# Patient Record
Sex: Female | Born: 1960 | Race: White | Hispanic: No | Marital: Married | Smoking: Never smoker
Health system: Southern US, Community
[De-identification: ages and names within clinical notes are randomized; demographics above are authoritative.]

## PROBLEM LIST (undated history)

## (undated) DIAGNOSIS — Z8739 Personal history of other diseases of the musculoskeletal system and connective tissue: Secondary | ICD-10-CM

## (undated) DIAGNOSIS — Z8719 Personal history of other diseases of the digestive system: Secondary | ICD-10-CM

## (undated) DIAGNOSIS — Z9889 Other specified postprocedural states: Secondary | ICD-10-CM

## (undated) DIAGNOSIS — E785 Hyperlipidemia, unspecified: Secondary | ICD-10-CM

## (undated) HISTORY — DX: Other specified postprocedural states: Z98.890

## (undated) HISTORY — DX: Personal history of other diseases of the digestive system: Z87.19

## (undated) HISTORY — DX: Hyperlipidemia, unspecified: E78.5

## (undated) HISTORY — PX: TONSILLECTOMY: SUR1361

## (undated) HISTORY — DX: Personal history of other diseases of the musculoskeletal system and connective tissue: Z87.39

---

## 1984-08-16 HISTORY — PX: OTHER SURGICAL HISTORY: SHX169

## 1998-08-01 ENCOUNTER — Other Ambulatory Visit: Admission: RE | Admit: 1998-08-01 | Discharge: 1998-08-01 | Payer: Self-pay | Admitting: *Deleted

## 2000-12-08 ENCOUNTER — Encounter: Admission: RE | Admit: 2000-12-08 | Discharge: 2000-12-08 | Payer: Self-pay | Admitting: *Deleted

## 2000-12-08 ENCOUNTER — Encounter: Payer: Self-pay | Admitting: *Deleted

## 2001-01-06 ENCOUNTER — Other Ambulatory Visit: Admission: RE | Admit: 2001-01-06 | Discharge: 2001-01-06 | Payer: Self-pay | Admitting: *Deleted

## 2001-08-24 ENCOUNTER — Encounter (INDEPENDENT_AMBULATORY_CARE_PROVIDER_SITE_OTHER): Payer: Self-pay

## 2001-08-24 ENCOUNTER — Ambulatory Visit (HOSPITAL_COMMUNITY): Admission: RE | Admit: 2001-08-24 | Discharge: 2001-08-24 | Payer: Self-pay | Admitting: *Deleted

## 2003-07-19 ENCOUNTER — Encounter: Admission: RE | Admit: 2003-07-19 | Discharge: 2003-07-19 | Payer: Self-pay | Admitting: Family Medicine

## 2004-07-28 ENCOUNTER — Ambulatory Visit (HOSPITAL_COMMUNITY): Admission: RE | Admit: 2004-07-28 | Discharge: 2004-07-28 | Payer: Self-pay | Admitting: Family Medicine

## 2004-08-18 ENCOUNTER — Encounter: Admission: RE | Admit: 2004-08-18 | Discharge: 2004-08-18 | Payer: Self-pay | Admitting: Family Medicine

## 2005-02-24 ENCOUNTER — Ambulatory Visit: Payer: Self-pay | Admitting: Internal Medicine

## 2005-02-25 ENCOUNTER — Ambulatory Visit: Payer: Self-pay | Admitting: Internal Medicine

## 2005-08-16 HISTORY — PX: SURGERY OF LIP: SUR1315

## 2005-09-14 ENCOUNTER — Encounter: Admission: RE | Admit: 2005-09-14 | Discharge: 2005-09-14 | Payer: Self-pay | Admitting: Family Medicine

## 2005-09-16 ENCOUNTER — Encounter: Admission: RE | Admit: 2005-09-16 | Discharge: 2005-09-16 | Payer: Self-pay | Admitting: Family Medicine

## 2005-10-04 ENCOUNTER — Encounter: Admission: RE | Admit: 2005-10-04 | Discharge: 2006-01-02 | Payer: Self-pay | Admitting: Family Medicine

## 2006-09-15 ENCOUNTER — Encounter: Admission: RE | Admit: 2006-09-15 | Discharge: 2006-09-15 | Payer: Self-pay | Admitting: Family Medicine

## 2006-12-30 ENCOUNTER — Encounter: Admission: RE | Admit: 2006-12-30 | Discharge: 2006-12-30 | Payer: Self-pay | Admitting: Gastroenterology

## 2007-05-29 ENCOUNTER — Encounter: Payer: Self-pay | Admitting: Internal Medicine

## 2007-07-11 ENCOUNTER — Ambulatory Visit: Payer: Self-pay | Admitting: Internal Medicine

## 2007-07-11 DIAGNOSIS — Z8719 Personal history of other diseases of the digestive system: Secondary | ICD-10-CM

## 2007-07-11 DIAGNOSIS — IMO0001 Reserved for inherently not codable concepts without codable children: Secondary | ICD-10-CM

## 2007-07-11 DIAGNOSIS — R12 Heartburn: Secondary | ICD-10-CM

## 2007-07-11 LAB — CONVERTED CEMR LAB: Vit D, 1,25-Dihydroxy: 65 (ref 30–89)

## 2007-07-17 ENCOUNTER — Telehealth: Payer: Self-pay | Admitting: *Deleted

## 2007-07-17 LAB — CONVERTED CEMR LAB
ALT: 14 units/L (ref 0–35)
Alkaline Phosphatase: 52 units/L (ref 39–117)
BUN: 9 mg/dL (ref 6–23)
Basophils Absolute: 0.1 10*3/uL (ref 0.0–0.1)
CO2: 28 meq/L (ref 19–32)
Calcium: 9.4 mg/dL (ref 8.4–10.5)
Cholesterol: 178 mg/dL (ref 0–200)
Eosinophils Absolute: 0.1 10*3/uL (ref 0.0–0.6)
GFR calc Af Amer: 115 mL/min
GFR calc non Af Amer: 95 mL/min
HDL: 48.8 mg/dL (ref >39.0)
Hemoglobin: 13.6 g/dL (ref 12.0–15.0)
Lymphocytes Relative: 17.3 % (ref 12.0–46.0)
MCHC: 34.6 g/dL (ref 30.0–36.0)
MCV: 98.6 fL (ref 78.0–100.0)
Monocytes Absolute: 0.6 10*3/uL (ref 0.2–0.7)
Monocytes Relative: 10.4 % (ref 3.0–11.0)
Neutro Abs: 3.7 10*3/uL (ref 1.4–7.7)
Platelets: 279 10*3/uL (ref 150–400)
Potassium: 4.3 meq/L (ref 3.5–5.1)
Total Protein: 6.7 g/dL (ref 6.0–8.3)
Triglycerides: 78 mg/dL (ref 0–149)
VLDL: 16 mg/dL (ref 0–40)

## 2007-07-31 ENCOUNTER — Encounter: Payer: Self-pay | Admitting: Internal Medicine

## 2007-08-21 ENCOUNTER — Telehealth: Payer: Self-pay | Admitting: Family Medicine

## 2007-08-22 ENCOUNTER — Ambulatory Visit: Payer: Self-pay | Admitting: Internal Medicine

## 2007-08-22 ENCOUNTER — Telehealth: Payer: Self-pay | Admitting: Internal Medicine

## 2007-08-22 DIAGNOSIS — K219 Gastro-esophageal reflux disease without esophagitis: Secondary | ICD-10-CM | POA: Insufficient documentation

## 2007-09-18 ENCOUNTER — Encounter: Admission: RE | Admit: 2007-09-18 | Discharge: 2007-09-18 | Payer: Self-pay | Admitting: Internal Medicine

## 2007-09-29 ENCOUNTER — Ambulatory Visit: Payer: Self-pay | Admitting: Internal Medicine

## 2007-09-29 DIAGNOSIS — J309 Allergic rhinitis, unspecified: Secondary | ICD-10-CM | POA: Insufficient documentation

## 2007-09-29 DIAGNOSIS — R1310 Dysphagia, unspecified: Secondary | ICD-10-CM | POA: Insufficient documentation

## 2007-10-26 ENCOUNTER — Ambulatory Visit: Payer: Self-pay | Admitting: Internal Medicine

## 2007-12-21 ENCOUNTER — Encounter: Payer: Self-pay | Admitting: Internal Medicine

## 2008-01-12 ENCOUNTER — Ambulatory Visit: Payer: Self-pay | Admitting: Internal Medicine

## 2008-01-12 DIAGNOSIS — R635 Abnormal weight gain: Secondary | ICD-10-CM | POA: Insufficient documentation

## 2008-01-12 DIAGNOSIS — R7309 Other abnormal glucose: Secondary | ICD-10-CM | POA: Insufficient documentation

## 2008-01-16 LAB — CONVERTED CEMR LAB
CO2: 26 meq/L (ref 19–32)
Calcium: 8.9 mg/dL (ref 8.4–10.5)
Free T4: 0.9 ng/dL (ref 0.6–1.6)
GFR calc Af Amer: 98 mL/min
Glucose, Bld: 88 mg/dL (ref 70–99)
Hgb A1c MFr Bld: 5.4 % (ref 4.6–6.0)
Potassium: 4.3 meq/L (ref 3.5–5.1)
Sodium: 140 meq/L (ref 135–145)
T3, Free: 3.1 pg/mL (ref 2.3–4.2)

## 2008-09-18 ENCOUNTER — Encounter: Admission: RE | Admit: 2008-09-18 | Discharge: 2008-09-18 | Payer: Self-pay | Admitting: Internal Medicine

## 2008-10-18 ENCOUNTER — Ambulatory Visit: Payer: Self-pay | Admitting: Internal Medicine

## 2008-10-18 DIAGNOSIS — R42 Dizziness and giddiness: Secondary | ICD-10-CM | POA: Insufficient documentation

## 2008-10-21 ENCOUNTER — Telehealth: Payer: Self-pay | Admitting: Internal Medicine

## 2009-06-03 ENCOUNTER — Encounter (INDEPENDENT_AMBULATORY_CARE_PROVIDER_SITE_OTHER): Payer: Self-pay | Admitting: *Deleted

## 2009-07-03 ENCOUNTER — Encounter (INDEPENDENT_AMBULATORY_CARE_PROVIDER_SITE_OTHER): Payer: Self-pay | Admitting: *Deleted

## 2009-07-17 ENCOUNTER — Encounter: Payer: Self-pay | Admitting: Internal Medicine

## 2009-09-16 ENCOUNTER — Ambulatory Visit: Payer: Self-pay | Admitting: Internal Medicine

## 2009-09-16 DIAGNOSIS — R195 Other fecal abnormalities: Secondary | ICD-10-CM

## 2009-09-22 ENCOUNTER — Encounter: Admission: RE | Admit: 2009-09-22 | Discharge: 2009-09-22 | Payer: Self-pay | Admitting: Obstetrics and Gynecology

## 2010-06-26 ENCOUNTER — Ambulatory Visit: Payer: Self-pay | Admitting: Family Medicine

## 2010-09-02 ENCOUNTER — Other Ambulatory Visit: Payer: Self-pay | Admitting: Internal Medicine

## 2010-09-02 ENCOUNTER — Ambulatory Visit
Admission: RE | Admit: 2010-09-02 | Discharge: 2010-09-02 | Payer: Self-pay | Source: Home / Self Care | Attending: Internal Medicine | Admitting: Internal Medicine

## 2010-09-02 LAB — CONVERTED CEMR LAB
Blood in Urine, dipstick: NEGATIVE
Ketones, urine, test strip: NEGATIVE
Nitrite: NEGATIVE
Urobilinogen, UA: 0.2
WBC Urine, dipstick: NEGATIVE

## 2010-09-02 LAB — BASIC METABOLIC PANEL
BUN: 15 mg/dL (ref 6–23)
CO2: 28 mEq/L (ref 19–32)
Calcium: 9 mg/dL (ref 8.4–10.5)
Chloride: 105 mEq/L (ref 96–112)
Creatinine, Ser: 0.7 mg/dL (ref 0.4–1.2)
GFR: 100.1 mL/min (ref >60.00)
Glucose, Bld: 93 mg/dL (ref 70–99)
Potassium: 4.2 mEq/L (ref 3.5–5.1)
Sodium: 138 mEq/L (ref 135–145)

## 2010-09-02 LAB — CBC WITH DIFFERENTIAL/PLATELET
Basophils Absolute: 0 10*3/uL (ref 0.0–0.1)
Basophils Relative: 0.6 % (ref 0.0–3.0)
Eosinophils Absolute: 0 10*3/uL (ref 0.0–0.7)
Eosinophils Relative: 0.6 % (ref 0.0–5.0)
HCT: 39.6 % (ref 36.0–46.0)
Hemoglobin: 13.8 g/dL (ref 12.0–15.0)
Lymphocytes Relative: 27.8 % (ref 12.0–46.0)
Lymphs Abs: 1.3 10*3/uL (ref 0.7–4.0)
MCHC: 34.8 g/dL (ref 30.0–36.0)
MCV: 98.6 fl (ref 78.0–100.0)
Monocytes Absolute: 0.4 10*3/uL (ref 0.1–1.0)
Monocytes Relative: 7.7 % (ref 3.0–12.0)
Neutro Abs: 3 10*3/uL (ref 1.4–7.7)
Neutrophils Relative %: 63.3 % (ref 43.0–77.0)
Platelets: 232 10*3/uL (ref 150.0–400.0)
RBC: 4.01 Mil/uL (ref 3.87–5.11)
RDW: 13.6 % (ref 11.5–14.6)
WBC: 4.8 10*3/uL (ref 4.5–10.5)

## 2010-09-02 LAB — LIPID PANEL
Cholesterol: 173 mg/dL (ref 0–200)
HDL: 50 mg/dL (ref >39.00)
LDL Cholesterol: 107 mg/dL — ABNORMAL HIGH (ref 0–99)
Total CHOL/HDL Ratio: 3
Triglycerides: 78 mg/dL (ref 0.0–149.0)
VLDL: 15.6 mg/dL (ref 0.0–40.0)

## 2010-09-02 LAB — TSH: TSH: 1.34 u[IU]/mL (ref 0.35–5.50)

## 2010-09-02 LAB — HEPATIC FUNCTION PANEL
ALT: 17 U/L (ref 0–35)
AST: 18 U/L (ref 0–37)
Albumin: 4.1 g/dL (ref 3.5–5.2)
Alkaline Phosphatase: 50 U/L (ref 39–117)
Bilirubin, Direct: 0.1 mg/dL (ref 0.0–0.3)
Total Bilirubin: 1 mg/dL (ref 0.3–1.2)
Total Protein: 6.5 g/dL (ref 6.0–8.3)

## 2010-09-05 ENCOUNTER — Encounter: Payer: Self-pay | Admitting: Family Medicine

## 2010-09-06 ENCOUNTER — Encounter: Payer: Self-pay | Admitting: Internal Medicine

## 2010-09-09 ENCOUNTER — Encounter: Payer: Self-pay | Admitting: Internal Medicine

## 2010-09-09 ENCOUNTER — Ambulatory Visit
Admission: RE | Admit: 2010-09-09 | Discharge: 2010-09-09 | Payer: Self-pay | Source: Home / Self Care | Attending: Internal Medicine | Admitting: Internal Medicine

## 2010-09-09 ENCOUNTER — Other Ambulatory Visit: Payer: Self-pay | Admitting: Internal Medicine

## 2010-09-09 DIAGNOSIS — Z1231 Encounter for screening mammogram for malignant neoplasm of breast: Secondary | ICD-10-CM

## 2010-09-09 DIAGNOSIS — M7989 Other specified soft tissue disorders: Secondary | ICD-10-CM | POA: Insufficient documentation

## 2010-09-09 DIAGNOSIS — Z1239 Encounter for other screening for malignant neoplasm of breast: Secondary | ICD-10-CM

## 2010-09-12 ENCOUNTER — Encounter: Payer: Self-pay | Admitting: Internal Medicine

## 2010-09-17 NOTE — Assessment & Plan Note (Signed)
Summary: sinus infection?/dm   Vital Signs:  Patient profile:   50 year old female Height:      65.25 inches (165.74 cm) Weight:      185 pounds (84.09 kg) BMI:     30.66 O2 Sat:      98 % on Room air Temp:     98.1 degrees F (36.72 degrees C) oral Pulse rate:   77 / minute BP sitting:   130 / 86  (left arm) Cuff size:   regular  Vitals Entered By: Josph Macho RMA (June 26, 2010 1:04 PM)  O2 Flow:  Room air CC: Sore throat, head congestion, ears hurt (more left), cough w/phlegm (creamy) X6 days/ CF Is Patient Diabetic? No   History of Present Illness: 50 y/o WF  who presents complaining of upper respiratory symptoms for  7 days.  Mostley nasal congestion/runny nose, PND cough.  No fevers, no wheezing or SOB.  Ears hurt, head feels very full/pressure.   ST mild at most.  Describes "double-sickening".     Preventive Screening-Counseling & Management  Alcohol-Tobacco     Smoking Status: never  Current Problems (verified): 1)  Nonspecific Abnormal Finding in Stool Contents  (ICD-792.1) 2)  Intermittent Vertigo  (ICD-780.4) 3)  Hyperglycemia, Mild  (ICD-790.29) 4)  Weight Gain  (ICD-783.1) 5)  Cough  (ICD-786.2) 6)  Dysphagia  (ICD-787.20) 7)  Rhinitis  (ICD-477.9) 8)  Gerd  (ICD-530.81) 9)  Diverticulitis, Hx of  (ICD-V12.79) 10)  Heartburn  (ICD-787.1) 11)  Heartburn, Hx of  (ICD-V12.79) 12)  Preventive Health Care  (ICD-V70.0) 13)  Fibromyalgia  (ICD-729.1)  Medications Prior to Update: 1)  Vivelle-Dot 0.05 Mg/24hr Pttw (Estradiol) 2)  Prometrium 100 Mg Caps (Progesterone Micronized)  Allergies (verified): 1)  ! Aspirin  Past History:  Past medical, surgical, family and social histories (including risk factors) reviewed, and no changes noted (except as noted below).  Past Medical History: Reviewed history from 10/18/2008 and no changes required. Rt breast mass (fibroid) Diverticulitis, hx of fibromyalgia g1p1  Consults Dr. Seymour Bars  Past Surgical  History: Reviewed history from 07/11/2007 and no changes required. ovarian cyst removed 86 Left wrist surgery 94 Lump removed on inside of lip2007 Tonsillectomy  Family History: Reviewed history from 10/18/2008 and no changes required. sinus cancer father died quickly mgf dm  gm nephrectomy  long lived. no osteoporosis or thyroid disease. sib with bipolar  son with GERD     Social History: Reviewed history from 10/18/2008 and no changes required. Occupation: Futures trader  BA degree Married Never Smoked Alcohol use-yes  wine 4-7 per week Drug use-no Regular exercise-yes    Review of Systems  The patient denies anorexia, weight loss, chest pain, and hemoptysis.         Also, see HPI.  Physical Exam  General:  Gen: alert, NAD, NONTOXIC. HEENT: eyes without injection, drainage, or swelling.  Ears: EACs clear, TMs with normal light reflex and landmarks.  Nose: some dried, crusty exudate adherent to some mildly injected mucosa.  No purulent d/c.  No paranasal sinus TTP.  No facial swelling.  Throat and mouth without focal lesion.  No pharyngial swelling, erythema, or exudate.   Neck: supple, no LAD.  LUNGS: CTA bilat, nonlabored resps.  CV: RRR, no m/r/g.     Impression & Recommendations:  Problem # 1:  SINUSITIS - ACUTE-NOS (ICD-461.9) Saline nasal spray recommended.  Take OTC claritin or zyrtec as needed. Take abx as prescribed.  Her updated medication list for this problem  includes:    Zithromax Z-pak 250 Mg Tabs (Azithromycin) .Marland Kitchen... As directed  Complete Medication List: 1)  Vivelle-dot 0.05 Mg/24hr Pttw (Estradiol) 2)  Prometrium 100 Mg Caps (Progesterone micronized) 3)  Zithromax Z-pak 250 Mg Tabs (Azithromycin) .... As directed  Patient Instructions: 1)  Use saline nasal spray 2-3 times per day, take antibiotics as prescribed.  Take otc claritin or zyrtec once daily.  Make follow up appointment or call if not significantly improved in 1  wk. Prescriptions: ZITHROMAX Z-PAK 250 MG TABS (AZITHROMYCIN) as directed  #1 pack x 0   Entered and Authorized by:   Michell Heinrich M.D.   Signed by:   Michell Heinrich M.D. on 06/26/2010   Method used:   Electronically to        [facility]  [place] [address] (retail)       [address]       [place], [place]  [address]       Ph: [phone] or [phone]       Fax: [phone]   RxID:   [id number]     Immunization History:  Influenza Immunization History:    Influenza:  historical (04/16/2010)   Immunization History:  Influenza Immunization History:    Influenza:  Historical (04/16/2010)

## 2010-09-17 NOTE — Assessment & Plan Note (Signed)
Summary: CPX // RS   Vital Signs:  Patient profile:   50 year old female Menstrual status:  postmenopausal Height:      65 inches Weight:      188 pounds BMI:     31.40 Pulse rate:   72 / minute BP sitting:   140 / 80  (right arm) Cuff size:   regular  Vitals Entered By: Romualdo Bolk, CMA (AAMA) (September 09, 2010 2:43 PM)  Nutrition Counseling: Patient's BMI is greater than 25 and therefore counseled on weight management options. CC: CPX without pap- Pt has a gyn who does paps. LMP - Character: normal Menarche (age onset years): 11   Menses interval (days): varies Menstrual flow (days): 4 Menstrual Status postmenopausal Last PAP Result normal   History of Present Illness: Melinda Harrington comes in today  for preventive visit .  She has gyne to see her for her PAP>  Since last visit  here  there have been no major changes in health status  . However she has had some problems with swelling in her legs and her right ankle swelling without noted pain injury. Criss Alvine at [facility] orthopedics did a ultrasound which showed no clock. She was told to try compression stockings but she says they don't really work but only tried it a few days. She denies chest pain shortness of breath.  She is on hormone replacement therapy. Dr. Rosalio Macadamia was her previous GYN she will be changing to Dr. Seymour Bars. GERD:  she is taking   Zegerid  as needed for her reflux symptoms. fibromyalgia no change  Preventive Care Screening  Pap Smear:    Date:  05/16/2010    Results:  normal   Last Tetanus Booster:    Date:  08/16/2008    Results:  Tdap   Prior Values:    Mammogram:  BI-RADS CATEGORY 2:  Benign finding(s).^MM DIGITAL DIAGNOSTIC BILAT (09/18/2008)    Colonoscopy:  Normal (08/16/2004)    Bone Density:  Normal (08/16/2002)    Dexa Interp:  Normal (08/16/2002)   Preventive Screening-Counseling & Management  Alcohol-Tobacco     Alcohol drinks/day: 1     Alcohol type: wine  Smoking Status: never  Caffeine-Diet-Exercise     Does Patient Exercise: yes     Type of exercise: [employer]      Exercise (avg: min/session): 30-60     Times/week: 4  Hep-HIV-STD-Contraception     Dental Visit-last 6 months yes     Sun Exposure-Excessive: no  Safety-Violence-Falls     Seat Belt Use: 100     Smoke Detectors: yes  Problems Prior to Update: 1)  Sinusitis - Acute-nos  (ICD-461.9) 2)  Nonspecific Abnormal Finding in Stool Contents  (ICD-792.1) 3)  Intermittent Vertigo  (ICD-780.4) 4)  Hyperglycemia, Mild  (ICD-790.29) 5)  Weight Gain  (ICD-783.1) 6)  Cough  (ICD-786.2) 7)  Dysphagia  (ICD-787.20) 8)  Rhinitis  (ICD-477.9) 9)  Gerd  (ICD-530.81) 10)  Diverticulitis, Hx of  (ICD-V12.79) 11)  Heartburn  (ICD-787.1) 12)  Heartburn, Hx of  (ICD-V12.79) 13)  Preventive Health Care  (ICD-V70.0) 14)  Fibromyalgia  (ICD-729.1)  Current Medications (verified): 1)  Vivelle-Dot 0.05 Mg/24hr Pttw (Estradiol) 2)  Prometrium 100 Mg Caps (Progesterone Micronized)  Allergies (verified): 1)  ! Aspirin  Past History:  Past medical, surgical, family and social histories (including risk factors) reviewed, and no changes noted (except as noted below).  Past Medical History: Reviewed history from 10/18/2008 and no changes required. Rt breast  mass (fibroid) Diverticulitis, hx of fibromyalgia g1p1  Consults Dr. Seymour Bars  Past Surgical History: Reviewed history from 07/11/2007 and no changes required. ovarian cyst removed 86 Left wrist surgery 94 Lump removed on inside of lip2007 Tonsillectomy  Past History:  Care Management: Gynecology: Rosalio Macadamia Gastroenterology: Randa Evens Orthopedics: [facility]  Family History: Reviewed history from 10/18/2008 and no changes required. sinus cancer father died quickly mgf dm  gm nephrectomy  long lived. no osteoporosis or thyroid disease. sib with bipolar  son with GERD     Social History: Reviewed history from 10/18/2008 and no  changes required. Occupation: Futures trader  BA degree Married Never Smoked Alcohol use-yes  wine 4-7 per week Drug use-no Regular exercise-yes  Dental Care w/in 6 mos.:  yes Sun Exposure-Excessive:  no  Review of Systems  The patient denies anorexia, fever, weight loss, vision loss, decreased hearing, chest pain, syncope, dyspnea on exertion, peripheral edema, headaches, abdominal pain, melena, hematochezia, hematuria, abnormal bleeding, and enlarged lymph nodes.         gets occasional palpitations on questioning but not associated with exercise these last seconds one question states that the fast heart rate. No associated syncope. She thinks it's related to menopause.  Physical Exam  General:  Well-developed,well-nourished,in no acute distress; alert,appropriate and cooperative throughout examination Head:  Normocephalic and atraumatic without obvious abnormalities. No apparent alopecia or balding. Eyes:  PERRL, EOMs full, conjunctiva clear  Ears:  R ear normal, L ear normal, and no external deformities.   Nose:  no external deformity and no nasal discharge.   Mouth:  good dentition and pharynx pink and moist.   Neck:  No deformities, masses, or tenderness noted. Breasts:  No mass, nodules, thickening, tenderness, bulging, retraction, inflamation, nipple discharge or skin changes noted.   Lungs:  Normal respiratory effort, chest expands symmetrically. Lungs are clear to auscultation, no crackles or wheezes. Heart:  Normal rate and regular rhythm. S1 and S2 normal without gallop, murmur, click, rub or other extra sounds. Abdomen:  Bowel sounds positive,abdomen soft and non-tender without masses, organomegaly or hernias noted. Genitalia:  per GYN Msk:  no joint warmth, no redness over joints, and no joint deformities.  right ankle shows some bugginess but no tenderness on the bone and good range of motion. Pulses are normal Pulses:  pulses intact without delay   Extremities:  trace left  pedal edema, 1+ left pedal edema, trace right pedal edema, and 1+ right pedal edema.   right ankle Neurologic:  alert & oriented X3.  this is a nonfocal exam no deficits noted Skin:  turgor normal, no ecchymoses, no petechiae, and no purpura.   Cervical Nodes:  No lymphadenopathy noted Axillary Nodes:  No palpable lymphadenopathy Inguinal Nodes:  No significant adenopathy Psych:  Normal eye contact, appropriate affect. Cognition appears normal.  EKG NSR  short pr .11  no delta wave.    Impression & Recommendations:  Problem # 1:  PREVENTIVE HEALTH CARE (ICD-V70.0)  Discussed nutrition,exercise,diet,healthy weight, vitamin D and calcium.    weight loss could help some of her symptoms ,  or body mass index is 30 and over.  her EKG shows a borderline short PR .11 but no delta waves if she has continued palpitations heart racing or any  syncopal like symptoms would recommend we get cardiology opinion. currently she appears to have no symptoms there is no family history.  Orders: EKG w/ Interpretation (93000)  Problem # 2:  FIBROMYALGIA (ICD-729.1) Assessment: Unchanged  Problem # 3:  GERD (  ICD-530.81) taking as needed medication  Problem # 4:  SWELLING OF LIMB (ICD-729.81)  this seems to be nonacute right or the left and a right ankle seems more involved but there is no pain there. No obvious cardiovascular pulmonary cause she may want to go back to ortho and have them look a right ankle. The meantime lifestyle interventions low-salt elevation exercise.  Orders: EKG w/ Interpretation (93000)  Complete Medication List: 1)  Vivelle-dot 0.05 Mg/24hr Pttw (Estradiol) 2)  Prometrium 100 Mg Caps (Progesterone micronized)  Patient Instructions: 1)  consider  seeing  ortho about right ankle if swelling adn discomfofrt. 2)  also if  racing heart recurrent call and can opinion form cardiology if persistent or  progressive   3)  Losing weight and lower ssodium diet may help the leg swelling  also and your overall health.  4)  Check  BP ocass  nl is below 140/90 5)  other wise can check in a year.   Orders Added: 1)  Est. Patient 40-64 years [99396] 2)  Est. Patient Level II [16109] 3)  EKG w/ Interpretation [93000]

## 2010-09-17 NOTE — Consult Note (Signed)
Summary: [facility]   Imported By: Florinda Marker 09/22/2009 10:50:08  _____________________________________________________________________  External Attachment:    Type:   Image     Comment:   External Document

## 2010-09-17 NOTE — Assessment & Plan Note (Signed)
Summary: new pt parasite/seen in 06/records in storage cannot locate   CC:  symptoms, finding stringing, ivory colored 1 - 1.5 inches in length items in her stools for 10 + years, itching sensation at night , and feeling tired.  History of Present Illness: Melinda Harrington is a 50 yo who is referred to me by Dr. Vilinda Boehringer for evaluation of possible intestinal parasite infection. She says that, off and on, for the past 10 years she has noted "long, thin" objects in her stool. They can be on the outside and mixed in. They are "ivory or opague, slightly curver" and non-motile. She has occassional rectal itching and stinging and chronic, mild LLQ discomfort and she wonders if these sxs could be due to infection. She also wonders if these objects in her stool could be "intestinal lining" caused by her past bouts of diverticulitis.  She has lived and traveled in a variety of contries in [place] and [place]. Over the past 7 years she has seen multiple doctors ( including me in 2006) and has received treatment with metronidazole, Vermox, and OTC Pin-X on many occassions.  She cannot tell that these treatments helped.  She had a negative test for pinworms in 2007 and 5, separate negatives stool exams for ova and parasites in October and November of 2010.  Preventive Screening-Counseling & Management  Alcohol-Tobacco     Alcohol drinks/day: 1     Alcohol type: wine     Smoking Status: never  Caffeine-Diet-Exercise     Does Patient Exercise: yes     Type of exercise: [employer]      Exercise (avg: min/session): 30-60     Times/week: 4  Safety-Violence-Falls     Seat Belt Use: 100   Updated Prior Medication List: VIVELLE-DOT 0.05 MG/24HR PTTW (ESTRADIOL)  PROMETRIUM 100 MG CAPS (PROGESTERONE MICRONIZED)   Current Allergies (reviewed today): ! ASPIRIN Review of Systems       The patient complains of weight gain.  The patient denies anorexia, fever, weight loss, melena, hematochezia, and  severe indigestion/heartburn.    Vital Signs:  Patient profile:   50 year old female Height:      65.25 inches (165.74 cm) Weight:      179.3 pounds (81.50 kg) BMI:     29.72 Temp:     97.3 degrees F (36.28 degrees C) oral Pulse rate:   77 / minute BP sitting:   143 / 82  Vitals Entered By: Jennet Maduro RN (September 16, 2009 11:30 AM) CC: symptoms, finding stringing, ivory colored 1 - 1.5 inches in length items in her stools for 10 + years, itching sensation at night , feeling tired Is Patient Diabetic? No Pain Assessment Patient in pain? no      Nutritional Status BMI of 25 - 29 = overweight Nutritional Status Detail appetite "normal"  Have you ever been in a relationship where you felt threatened, hurt or afraid?not asked today   Does patient need assistance? Functional Status Self care Ambulation Normal   Physical Exam  General:  alert and overweight-appearing.   Lungs:  normal breath sounds, no crackles, and no wheezes.   Heart:  normal rate, regular rhythm, and no murmur.   Abdomen:  soft, non-tender, normal bowel sounds, no masses, no hepatomegaly, and no splenomegaly.   Psych:  normally interactive, good eye contact, not anxious appearing, and not depressed appearing.     Impression & Recommendations:  Problem # 1:  NONSPECIFIC ABNORMAL FINDING IN STOOL CONTENTS (  ICD-792.1) I do not think the objects in her stool represent infection with parasites or other organims and it is very unlikely that they are related to any serious bowel pathology. I told her that with 5 recent negative O&P exams it is unlikely that further testing for parasites would be helpful. She does not appear to have delusions of parasitosis and seemed reassured. She agreed to no further testing at this time. If it continues to bother her I offerred to review microscopic slides of stool samples with one of our pathologists to see if we can tell her what the objects might be. Orders: T- * Misc.  Laboratory test [phone]) Consultation Level III [phone])  Medications Added to Medication List This Visit: 1)  Vivelle-dot 0.05 Mg/24hr Pttw (Estradiol) 2)  Prometrium 100 Mg Caps (Progesterone micronized) Process Orders Check Orders Results:     [facility]: ABN not required for this insurance Tests Sent for requisitioning (September 17, 2009 12:08 PM):     09/16/2009: [facility] -- T- * Misc. Laboratory test [phone] (signed)

## 2010-09-17 NOTE — Consult Note (Signed)
[facility]   Imported By: Florinda Marker 09/22/2009 12:04:17  _____________________________________________________________________  External Attachment:    Type:   Image     Comment:   External Document

## 2010-09-17 NOTE — Miscellaneous (Signed)
Summary: HIPAA Restrictions  HIPAA Restrictions   Imported By: Florinda Marker 09/16/2009 15:06:14  _____________________________________________________________________  External Attachment:    Type:   Image     Comment:   External Document

## 2010-09-23 ENCOUNTER — Ambulatory Visit
Admission: RE | Admit: 2010-09-23 | Discharge: 2010-09-23 | Disposition: A | Discharge status: Home / Self Care | Payer: Managed Care, Other (non HMO) | Source: Ambulatory Visit | Attending: Internal Medicine | Admitting: Internal Medicine

## 2010-09-23 DIAGNOSIS — Z1231 Encounter for screening mammogram for malignant neoplasm of breast: Secondary | ICD-10-CM

## 2011-01-01 NOTE — H&P (Signed)
[facility]  Patient:    Melinda Harrington, Melinda Harrington Visit Number: [id number] MRN: [id number]          Service Type: Attending:  Sung Amabile. Roslyn Smiling, M.D. Dictated by:   Sung Amabile Roslyn Smiling, M.D. Adm. Date:  08/24/01                           History and Physical  DATE OF BIRTH:                1961/06/22  CHIEF COMPLAINT:              One-and-a-half-year history of intermenstrual bleeding, endocervical mass on ultrasound.  HISTORY OF PRESENT ILLNESS:   A 50 year old woman, G2, P1-0-1-1, using condoms for contraception, admitted for operative hysteroscopy and D&C to evaluate her intermittent menstrual bleeding which she has experienced for almost a year and a half.  Cycles are regular.  Workup in the past has included a normal Pap smear in May as well as negative Chlamydia and gonorrhea cultures.  Pelvic ultrasound performed June 06, 2001, showed an echogenic mass measuring 1.6 cm in the endocervix.  Endometrium was 1.1 cm at mid cycle.  Two small fibroids were also seen.                                The patient has been reluctant to schedule surgery but is now ready to proceed.  PAST MEDICAL HISTORY:         History of fibromyalgia.  PAST SURGICAL HISTORY:        Ovarian cystectomy in 1984, wrist surgery in 1993, ? cryocautery of cervix in 1986, T&A as a child.  OBSTETRICAL HISTORY:          In 1993, a full-term vaginal delivery.  ALLERGIES:                    ASPIRIN and NONSTEROIDAL ANTI-INFLAMMATORY MEDICATION causes swelling.  MEDICATIONS:                  Multivitamins and Allegra-D p.r.n.  FAMILY HISTORY:               Father with sinus carcinoma, mother alive and well, sister with asthma.  SOCIAL HISTORY:               Married.  Homemaker.  Nonsmoker.  Drinks two to three times weekly, wine.  PHYSICAL EXAMINATION:  GENERAL:                      Healthy-appearing woman.  VITAL SIGNS:                  Blood pressure 122/72, pulse 80,  afebrile.  HEENT:                        Within normal limits.  NECK:                         Without thyromegaly.  CHEST:                        Clear.  COR:                          Regular rate and  rhythm.  S1, S2 normal.  BREASTS:                      Without mass, tenderness, axillary or supraclavicular adenopathy.  ABDOMEN:                      Soft, nontender.  Well-healed laparoscopy scar. No organomegaly, mass, or hernia.  BACK:                         Without CVAT.  GU:                           External genitalia, BUS, vagina without lesion. Cervix friable with pathology brush.  Uterus retroverted, normal size, nontender, mobile.  Adnexa normal to palpation.  Rectovaginal exam confirmatory.  EXTREMITIES:                  Without CCE.  SKIN:                         Without lesions.  NEUROLOGIC:                   Grossly intact.  ASSESSMENT:                   1. Intermenstrual bleeding - longstanding.                               2. Endocervical mass on ultrasound.                               3. Endometrium thickened.                               4. Small fibroids on ultrasound.  PLAN:                         Operative hysteroscopy and dilation and curettage.  The patient has been counseled regarding the benefits, risks, options, and expected outcome of this procedure prior to surgery.  Questions have been answered and a consent has been signed. Dictated by:   Sung Amabile Roslyn Smiling, M.D. Attending:  Sung Amabile. Roslyn Smiling, M.D. DD:  08/23/01 TD:  08/23/01 Job: [id number]

## 2011-01-01 NOTE — Op Note (Signed)
[facility]  Patient:    Melinda Harrington, Melinda Harrington Visit Number: [id number] MRN: [id number]          Service Type: DSU Location: [facility] Attending Physician:  Ardeen Fillers Dictated by:   Sung Amabile. Roslyn Smiling, M.D. Proc. Date: 08/24/01 Admit Date:  08/24/2001                             Operative Report  INDICATIONS:                  A 50 year old woman G2, P1-0-1-1 admitted for operative hysteroscopy and D&C to evaluate intermittent menstrual bleeding which she has experienced for more than a year and a half.  Cycles are otherwise regular.  Work-up has included pelvic ultrasound in October 2002 which showed an echogenic endocervical mass and thickened endometrium as well as two small fibroids.  She is admitted now to rule out endometrial or endocervical pathology.  PREOPERATIVE DIAGNOSES:       Abnormal uterine bleeding, endocervical mass on ultrasound.  POSTOPERATIVE DIAGNOSES:      Abnormal uterine bleeding, endocervical mass on ultrasound, endometrial mass.  PROCEDURE:                    Operative hysteroscopy and dilatation and curettage.  SURGEON:                      Sung Amabile. Roslyn Smiling, M.D.  ANESTHESIA:                   General anesthesia via LMA, paracervical block.  ESTIMATED BLOOD LOSS:         30 cc.  TUBES AND DRAINS:             None.  COMPLICATIONS:                None.  FINDINGS:                     Uterus retroverted, sounded to 8 cm. Endometrial polypoid appearing and endocervical polypoid appearing masses excised.  SPECIMEN:                     Endometrial and endocervical hysteroscopic biopsies and endometrial curettings to pathology.  PROCEDURE:                    After the establishment of general anesthesia the patient was placed in the dorsal lithotomy position.  The perineum and vagina were prepped with Betadine solution.  The bladder was evacuated with straight catheterization.  The patient was draped.  Examination  under anesthesia with the above findings was carried out.  Graves speculum was inserted in the vagina.  The cervix was reprepped with Betadine solution.  The anterior cervical lip and the posterior cervical lip were infiltrated with 1% Nesacaine.  The anterior cervical lip was grasped with a single tooth tenaculum.  Paracervical block was placed in the usual fashion using 20 cc of 1% Nesacaine.  Better traction was obtained with application of the single tooth tenaculum to the posterior lip of the cervix.  The uterus was sounded to 8 cm.  Pratt dilators were used to dilate the cervix to a #37 [place].  The hysteroscope was passed easily into the endometrial cavity.  Sorbitol was the distending medium with introduction pressures ranging between 65-80 mmHg pressure.  Photographs were taken.  Using  a 90 degree double hysteroscopic loop with settings of 190 and 110 (cutting and coagulation, respectively) the endometrial and endocervical masses were shaved away.  The biopsies were sent for pathologic evaluation.  Scope was removed and sharp curettage was performed.  Curettings were sent separately.  The hysteroscope was reintroduced and further resection of the endocervical polypoid mass was carried out.  Scope was removed.  Single tooth tenaculum was removed.  Pressure was held against the cervix until hemostasis was accomplished.  All other instruments were removed.  The patient was returned to the supine position, extubated without difficulty, and transported to the room in satisfactory condition.  Sorbitol deficit at the end of the case was 200 cc. Dictated by:   Sung Amabile Roslyn Smiling, M.D. Attending Physician:  Ardeen Fillers DD:  08/24/01 TD:  08/24/01 Job: [id number]

## 2011-01-22 ENCOUNTER — Other Ambulatory Visit: Payer: Self-pay | Admitting: Gastroenterology

## 2011-01-28 ENCOUNTER — Ambulatory Visit
Admission: RE | Admit: 2011-01-28 | Discharge: 2011-01-28 | Disposition: A | Discharge status: Home / Self Care | Payer: Managed Care, Other (non HMO) | Source: Ambulatory Visit | Attending: Gastroenterology | Admitting: Gastroenterology

## 2011-08-26 ENCOUNTER — Other Ambulatory Visit: Payer: Self-pay | Admitting: Internal Medicine

## 2011-08-26 DIAGNOSIS — Z1231 Encounter for screening mammogram for malignant neoplasm of breast: Secondary | ICD-10-CM

## 2011-09-30 ENCOUNTER — Ambulatory Visit
Admission: RE | Admit: 2011-09-30 | Discharge: 2011-09-30 | Disposition: A | Discharge status: Home / Self Care | Payer: Commercial Indemnity | Source: Ambulatory Visit | Attending: Internal Medicine | Admitting: Internal Medicine

## 2011-09-30 DIAGNOSIS — Z1231 Encounter for screening mammogram for malignant neoplasm of breast: Secondary | ICD-10-CM

## 2012-09-06 ENCOUNTER — Other Ambulatory Visit: Payer: Self-pay | Admitting: Internal Medicine

## 2012-09-06 DIAGNOSIS — Z1231 Encounter for screening mammogram for malignant neoplasm of breast: Secondary | ICD-10-CM

## 2012-09-21 ENCOUNTER — Other Ambulatory Visit: Payer: Self-pay | Admitting: Gastroenterology

## 2012-09-21 DIAGNOSIS — R1032 Left lower quadrant pain: Secondary | ICD-10-CM

## 2012-09-25 ENCOUNTER — Ambulatory Visit
Admission: RE | Admit: 2012-09-25 | Discharge: 2012-09-25 | Disposition: A | Discharge status: Home / Self Care | Payer: Commercial Indemnity | Source: Ambulatory Visit | Attending: Gastroenterology | Admitting: Gastroenterology

## 2012-09-25 DIAGNOSIS — R1032 Left lower quadrant pain: Secondary | ICD-10-CM

## 2012-09-25 MED ORDER — IOHEXOL 300 MG/ML  SOLN
100.0000 mL | Freq: Once | INTRAMUSCULAR | Status: AC | PRN
  Administered 2012-09-25: 100 mL via INTRAVENOUS

## 2012-10-12 ENCOUNTER — Ambulatory Visit
Admission: RE | Admit: 2012-10-12 | Discharge: 2012-10-12 | Disposition: A | Discharge status: Home / Self Care | Payer: Managed Care, Other (non HMO) | Source: Ambulatory Visit | Attending: Internal Medicine | Admitting: Internal Medicine

## 2012-11-01 ENCOUNTER — Ambulatory Visit (INDEPENDENT_AMBULATORY_CARE_PROVIDER_SITE_OTHER): Payer: Managed Care, Other (non HMO) | Admitting: Family Medicine

## 2012-11-01 VITALS — BP 118/80 | HR 81 | Temp 99.1°F | Wt 187.0 lb

## 2012-11-01 DIAGNOSIS — H109 Unspecified conjunctivitis: Secondary | ICD-10-CM

## 2012-11-01 MED ORDER — SULFACETAMIDE SODIUM 10 % OP SOLN: OPHTHALMIC | Status: DC

## 2012-11-01 NOTE — Progress Notes (Signed)
Chief Complaint  Patient presents with  . left eye redness    dry     HPI: Acute visit for red eye: -L eye redness and irritation, R eye has been a little red too, a little dry -better today -has had some nasal congestion and sneezing when went in [place] -worried about pink eye -Denies: pain, vision loss, vomiting, nausea, fevers, pus -goes to [facility] for her glasses and eye exams -uses pataday from Dr. Hyacinth Meeker  ROS: See pertinent positives and negatives per HPI.  No past medical history on file.  No family history on file.  History   Social History  . Marital Status: Married    Spouse Name: N/A    Number of Children: N/A  . Years of Education: N/A   Social History Main Topics  . Smoking status: Not on file  . Smokeless tobacco: Not on file  . Alcohol Use: Not on file  . Drug Use: Not on file  . Sexually Active: Not on file   Other Topics Concern  . Not on file   Social History Narrative  . No narrative on file    Current outpatient prescriptions:PATADAY 0.2 % SOLN, , Disp: , Rfl: ;  progesterone (PROMETRIUM) 100 MG capsule, , Disp: , Rfl: ;  sulfacetamide (BLEPH-10) 10 % ophthalmic solution, 1 -2 drops in eyes every 4 hours for 7 days, Disp: 15 mL, Rfl: 0  EXAM:  Filed Vitals:   11/01/12 0919  BP: 118/80  Pulse: 81  Temp: 99.1 F (37.3 C)    Body mass index is 31.12 kg/(m^2).  GENERAL: vitals reviewed and listed above, alert, oriented, appears well hydrated and in no acute distress  HEENT: atraumatic, conjunttiva mildly erythematous bilat, no discharge from eyes, no TTP of globes, PERRLA, cisual acuity grossly intact, no obvious abnormalities on inspection of external nose and ears  NECK: no obvious masses on inspection  MS: moves all extremities without noticeable abnormality  PSYCH: pleasant and cooperative, no obvious depression or anxiety  ASSESSMENT AND PLAN:  Discussed the following assessment and plan:  Dry eye,  bilateral  Conjunctivitis - Plan: sulfacetamide (BLEPH-10) 10 % ophthalmic solution  -compresses, eye drops incase bacterial conjunctivitis but likely viral or allergic -no alarm features -return and ED precautions -advised to see her opthomologist is worsens or persists -Patient advised to return or notify a doctor immediately if symptoms worsen or persist or new concerns arise.  There are no Patient Instructions on file for this visit.   Kriste Basque R.

## 2012-11-13 ENCOUNTER — Telehealth: Payer: Self-pay | Admitting: Internal Medicine

## 2012-11-13 NOTE — Telephone Encounter (Signed)
Patient Information:  Caller Name: R[place]ia  Phone: [phone]  Patient: Melinda, Harrington  Gender: Female  DOB: 30-Aug-1960  Age: 52 Years  PCP: Berniece Andreas (Family Practice)  Pregnant: No  Office Follow Up:  Does the office need to follow up with this patient?: No  Instructions For The Office: N/A   Symptoms  Reason For Call & Symptoms: Pt states that she has some congestion at night and in the morning; she is leaving for [place] on 11/18/12; she wants to make sure that everything is okay before she leaves;  she is also having red eyes; denies difficulty breathing; cough is at night; sore throat at times; would like antibiotic in case she needs it; explained that no antibiotics will be called in unless she is seen  Reviewed Health History In EMR: Yes  Reviewed Medications In EMR: Yes  Reviewed Allergies In EMR: Yes  Reviewed Surgeries / Procedures: Yes  Date of Onset of Symptoms: 11/06/2012  Treatments Tried: Decongestant  Treatments Tried Worked: No OB / GYN:  LMP: Unknown  Guideline(s) Used:  Colds  Disposition Per Guideline:   See Today or Tomorrow in Office  Reason For Disposition Reached:   Patient wants to be seen  Advice Given:  N/A  Patient Will Follow Care Advice:  YES  Appointment Scheduled:  11/14/2012 09:45:00 Appointment Scheduled Provider:  Berniece Andreas (Family Practice)  Pt requesting appt for 11/14/12- offered homecare advice for today but pt declined

## 2012-11-13 NOTE — Telephone Encounter (Signed)
noted 

## 2012-11-14 ENCOUNTER — Encounter: Payer: Self-pay | Admitting: Internal Medicine

## 2012-11-14 ENCOUNTER — Ambulatory Visit (INDEPENDENT_AMBULATORY_CARE_PROVIDER_SITE_OTHER): Payer: Managed Care, Other (non HMO) | Admitting: Internal Medicine

## 2012-11-14 VITALS — BP 120/76 | HR 76 | Temp 98.0°F | Wt 187.0 lb

## 2012-11-14 DIAGNOSIS — J329 Chronic sinusitis, unspecified: Secondary | ICD-10-CM

## 2012-11-14 DIAGNOSIS — J069 Acute upper respiratory infection, unspecified: Secondary | ICD-10-CM

## 2012-11-14 MED ORDER — AMOXICILLIN 875 MG PO TABS: 875.0000 mg | ORAL_TABLET | Freq: Three times a day (TID) | ORAL | Status: DC

## 2012-11-14 NOTE — Progress Notes (Signed)
Chief Complaint  Patient presents with  . Sore Throat    Started 1 week ago. Coughing at night and taking Delsym.  Pt is going to [place] on Saturday.  . Cough  . Nasal Congestion  . Headache    HPI: Patient comes in today for SDA for  new problem evaluation. Last seen by me over 2 years ago saw dr [name] last   month for dry eye allergy sx. she is generally been doing quite well however his developed a new respiratory symptoms.  Onset sore throat  And then cough  taking OTCs Exposed to someone with acute bronchitis . Melinda Harrington also sick. As continued upper respiratory congestion and had a right-sided headache yesterday described as throbbing.  Has some nasal drainage no fever or chills but is leaving town on a long trip in 4 days. Once to be proactive and it gets worse like a sinus infection have ability to treat. Trip to [place] iin 5 days .   For a week.  No fever.  Some fatigue .  ocass cough with deslym and advil sinus.   Has the words. No hx of migraine.  No recent antibiotics except a short course of possible Keflex after an ingrown toenail removal. Congestion at night and  no respiratory distress ROS: See pertinent positives and negatives per HPI.  no sig gi change    EHR not abstracted   Reviewed in old system  Past Medical History  Diagnosis Date  . H/O diverticulitis of colon   . H/O fibromyalgia   . S/P wrist surgery     left    Family History  Problem Relation Age of Onset  . Bipolar disorder      sibling  . GER disease Son   . Cancer - Other      sinus father died quickly   . Renal Disease      nephrctomy GM  . Diabetes Maternal Grandfather     History   Social History  . Marital Status: Married    Spouse Name: N/A    Number of Children: N/A  . Years of Education: N/A   Social History Main Topics  . Smoking status: Never Smoker   . Smokeless tobacco: None  . Alcohol Use: None  . Drug Use: None  . Sexually Active: None   Other Topics Concern  . None    Social History Narrative   ba degree    Social etoh   Non smoker    G1P1    Outpatient Encounter Prescriptions as of 11/14/2012  Medication Sig Dispense Refill  . PATADAY 0.2 % SOLN       . progesterone (PROMETRIUM) 100 MG capsule       . amoxicillin (AMOXIL) 875 MG tablet Take 1 tablet (875 mg total) by mouth 3 (three) times daily.  30 tablet  0  . sulfacetamide (BLEPH-10) 10 % ophthalmic solution 1 -2 drops in eyes every 4 hours for 7 days  15 mL  0   No facility-administered encounter medications on file as of 11/14/2012.    EXAM:  BP 120/76  Pulse 76  Temp(Src) 98 F (36.7 C) (Oral)  Wt 187 lb (84.823 kg)  BMI 31.12 kg/m2  SpO2 98%  Body mass index is 31.12 kg/(m^2).  GENERAL: vitals reviewed and listed above, alert, oriented, appears well hydrated and in no acute distress  HEENT: atraumatic, conjunctiva  clear, no obvious abnormalities on inspection of external nose and ears  No face pain  congested OP : no lesion edema or exudate   NECK: no obvious masses on inspection palpation  No adenopathy   LUNGS: clear to auscultation bilaterally, no wheezes, rales or rhonchi, good air movement  CV: HRRR, no clubbing cyanosis or  peripheral edema nl cap refill   MS: moves all extremities without noticeable focal  abnormality  PSYCH: pleasant and cooperative, no obvious depression or anxiety  ASSESSMENT AND PLAN:  Discussed the following assessment and plan:  Sinusitis - poss underlying allergic also   planned air flight in 3 days  expectant management add antibiotic as appropriatesigns  URI, acute  -Patient advised to return or notify health care team  if symptoms worsen or persist or new concerns arise. Will do data entry and update  ehr when have time .  Patient Instructions  This seems to be a head cold chest cold viral but  At risk for a bacterial sinusitis.   Saline nose spray   And could try  nasacort otc  Nasal  Each day in case have allergy underlying this.    Can add antibiotic   If face pressure not getting better  2 weeks in to illness or relapsing sx.  Could may last for another  1-2 weeks.  Your lungs are clear today.      Melinda Harrington. Panosh M.D.  Extra time data input and review today  By provider

## 2012-11-14 NOTE — Patient Instructions (Addendum)
This seems to be a head cold chest cold viral but  At risk for a bacterial sinusitis.   Saline nose spray   And could try  nasacort otc  Nasal  Each day in case have allergy underlying this.   Can add antibiotic   If face pressure not getting better  2 weeks in to illness or relapsing sx.  Could may last for another  1-2 weeks.  Your lungs are clear today.

## 2013-09-07 ENCOUNTER — Other Ambulatory Visit: Payer: Self-pay

## 2013-09-07 DIAGNOSIS — Z1231 Encounter for screening mammogram for malignant neoplasm of breast: Secondary | ICD-10-CM

## 2013-10-15 ENCOUNTER — Ambulatory Visit
Admission: RE | Admit: 2013-10-15 | Discharge: 2013-10-15 | Disposition: A | Discharge status: Home / Self Care | Payer: Managed Care, Other (non HMO) | Source: Ambulatory Visit

## 2013-10-15 DIAGNOSIS — Z1231 Encounter for screening mammogram for malignant neoplasm of breast: Secondary | ICD-10-CM

## 2014-09-20 ENCOUNTER — Other Ambulatory Visit: Payer: Self-pay

## 2014-09-20 DIAGNOSIS — Z1231 Encounter for screening mammogram for malignant neoplasm of breast: Secondary | ICD-10-CM

## 2014-11-04 ENCOUNTER — Ambulatory Visit
Admission: RE | Admit: 2014-11-04 | Discharge: 2014-11-04 | Disposition: A | Discharge status: Home / Self Care | Payer: Managed Care, Other (non HMO) | Source: Ambulatory Visit

## 2014-11-04 DIAGNOSIS — Z1231 Encounter for screening mammogram for malignant neoplasm of breast: Secondary | ICD-10-CM

## 2014-12-07 IMAGING — MG MM DIGITAL SCREENING BILAT
9 series · 9 of 25 positions shown · non-contrast
Comparison: Previous exams.

CLINICAL DATA: Screening.

DIGITAL BILATERAL SCREENING MAMMOGRAM WITH CAD
DIGITAL BREAST TOMOSYNTHESIS
Digital breast tomosynthesis images are acquired in two
projections.  These images are reviewed in combination with the
digital mammogram, confirming the findings below.

[L CC (1 of 2)]
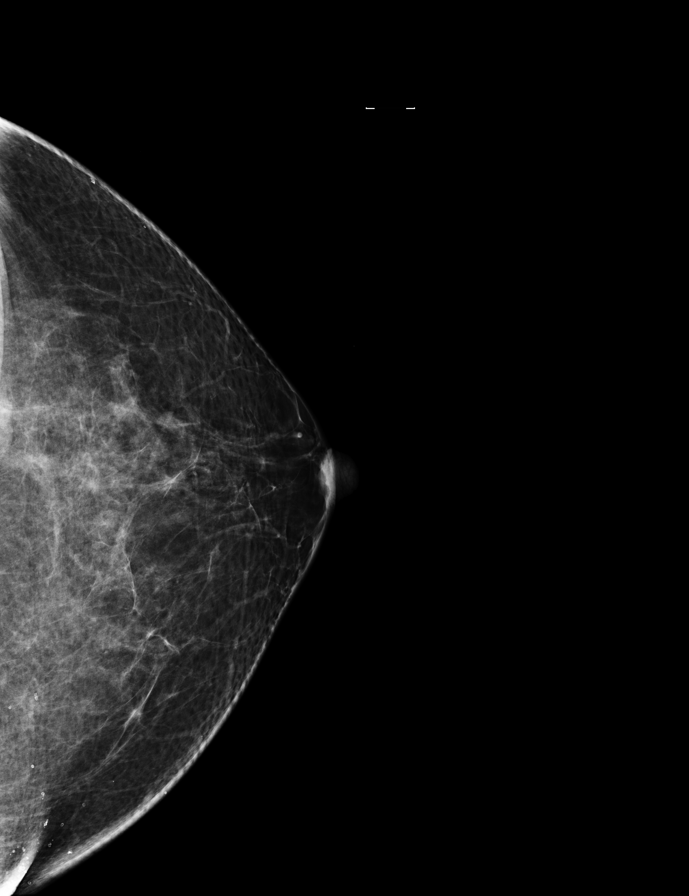

[L MLO]
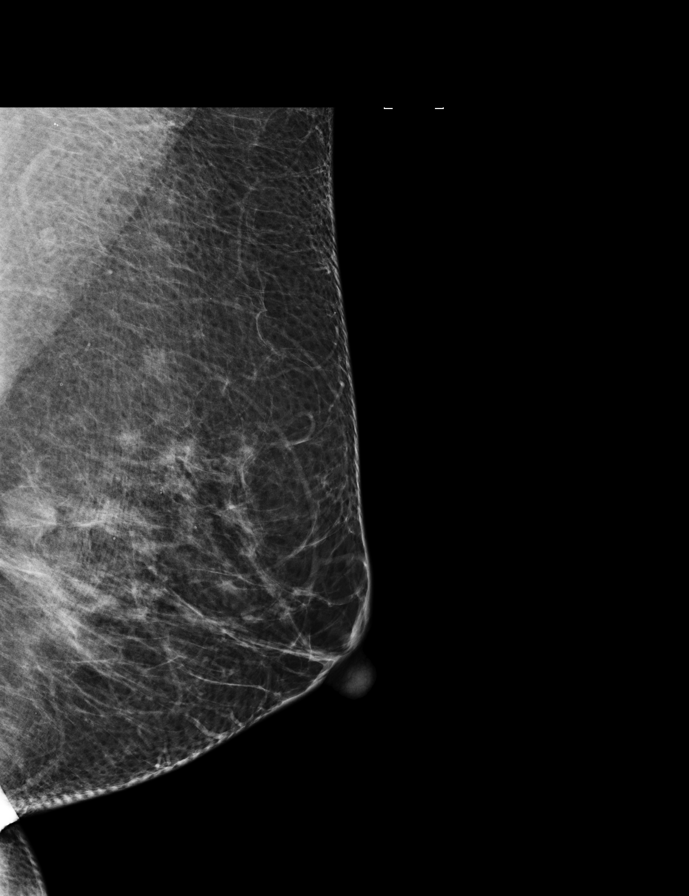

[L CC (2 of 2)]
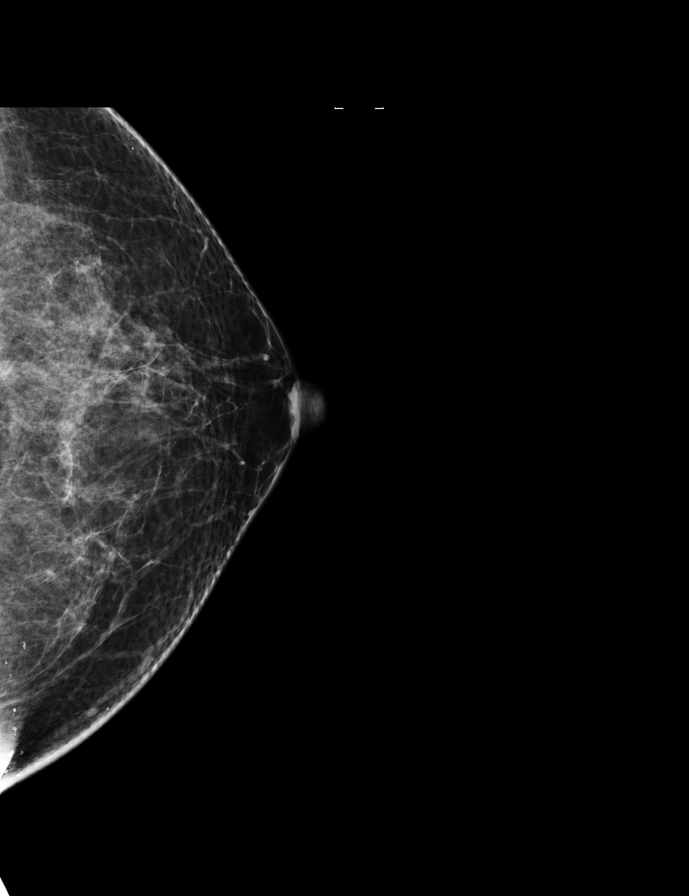

[R CC]
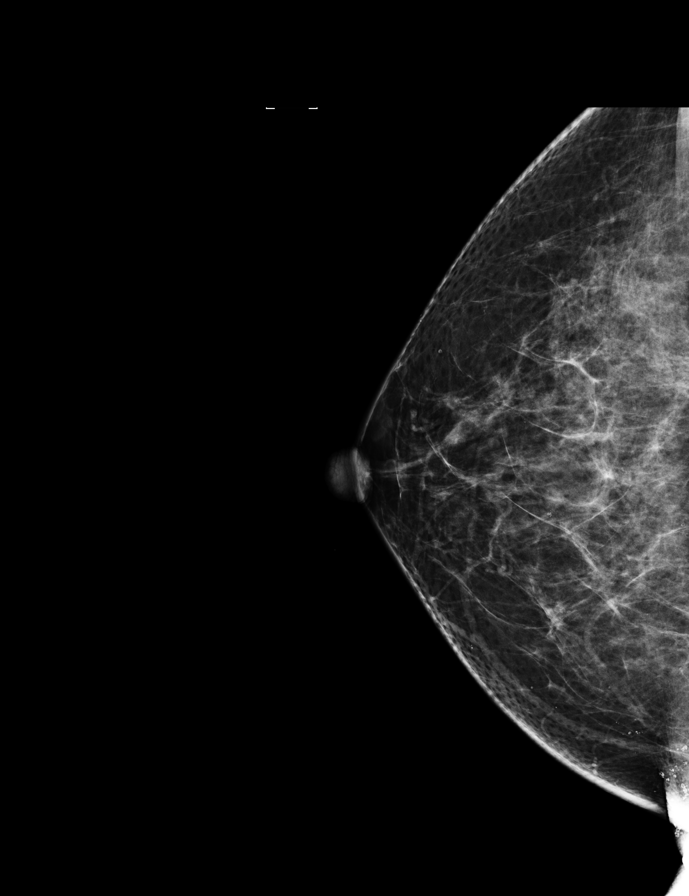

[R MLO]
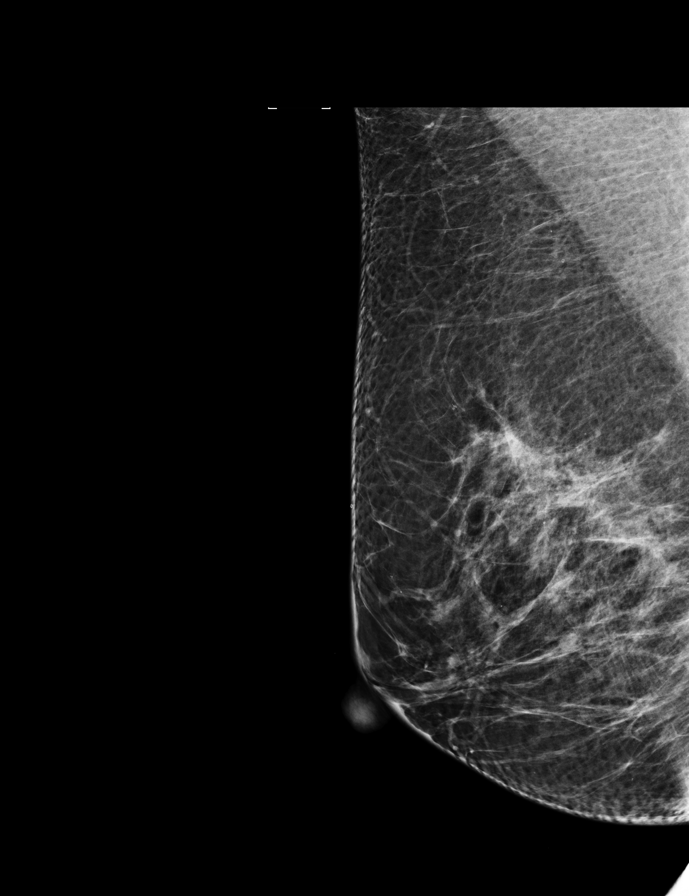

[L MLO tomo · tomo slice 46/91.0]
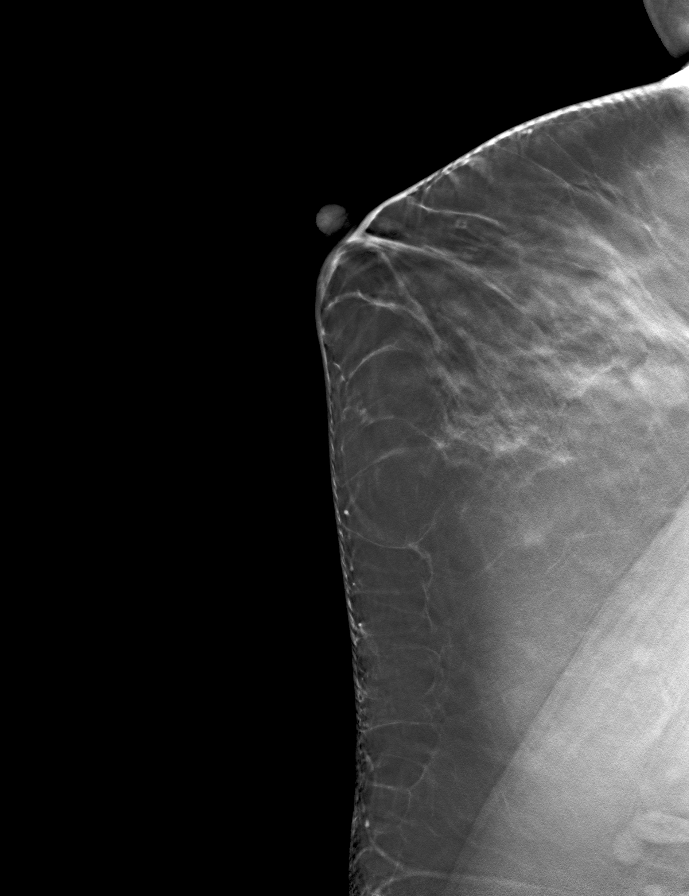

[R MLO tomo · tomo slice 43/85.0]
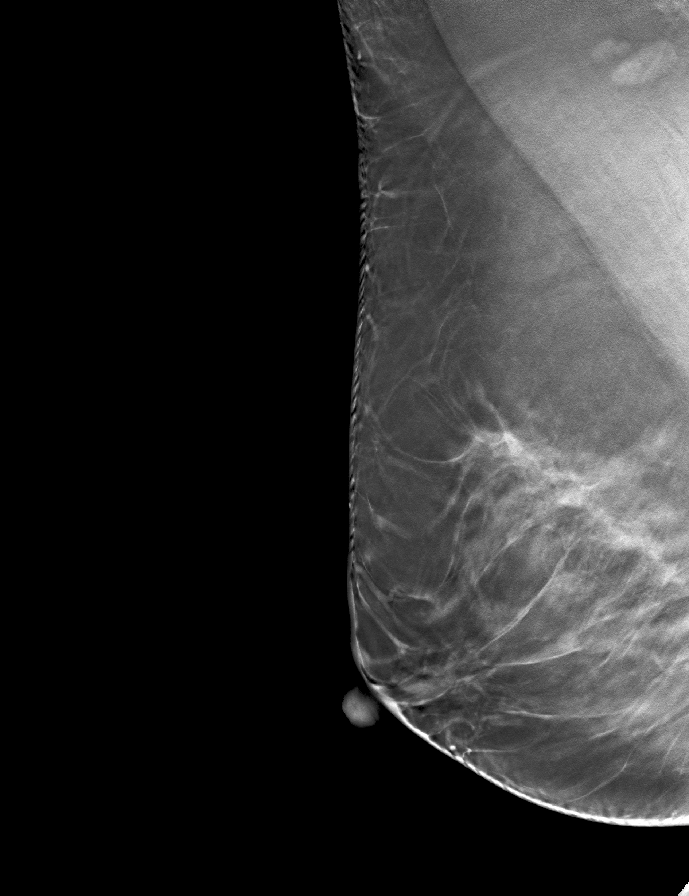

[R CC tomo · tomo slice 41/80.0]
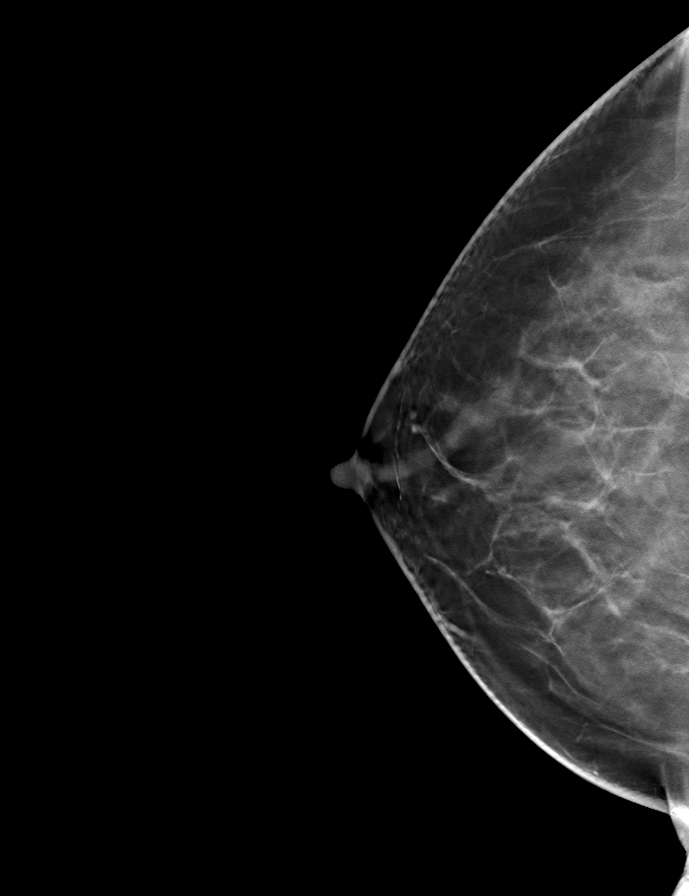

[L CC tomo · tomo slice 39/77.0]
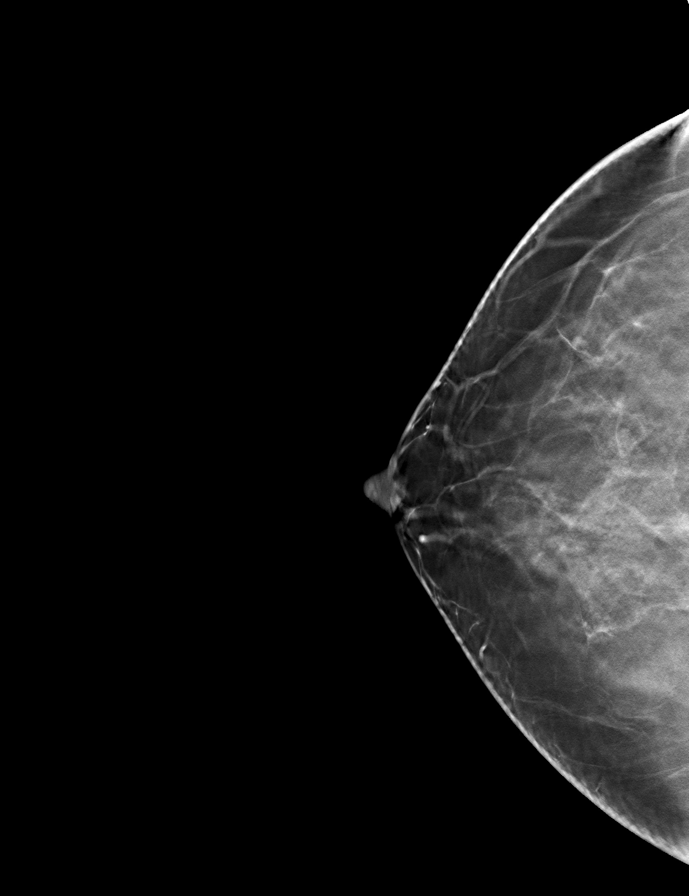

[9 of 25 positions shown; findings below may reference images not displayed]

FINDINGS: ACR Breast Density Category 2: There is a scattered fibroglandular
pattern.

No suspicious masses, architectural distortion, or calcifications
are present.

Images were processed with CAD.
IMPRESSION: No mammographic evidence of malignancy.

A result letter of this screening mammogram will be mailed directly
to the patient.

RECOMMENDATION:
Screening mammogram in one year. (Code:W9-V-L6H)

BI-RADS CATEGORY 1:  Negative.

## 2015-02-03 ENCOUNTER — Encounter: Payer: Self-pay | Admitting: Internal Medicine

## 2015-02-03 ENCOUNTER — Ambulatory Visit (INDEPENDENT_AMBULATORY_CARE_PROVIDER_SITE_OTHER): Payer: Managed Care, Other (non HMO) | Admitting: Internal Medicine

## 2015-02-03 VITALS — BP 122/76 | Temp 98.3°F | Ht 65.5 in | Wt 187.6 lb

## 2015-02-03 DIAGNOSIS — M533 Sacrococcygeal disorders, not elsewhere classified: Secondary | ICD-10-CM

## 2015-02-03 DIAGNOSIS — M5442 Lumbago with sciatica, left side: Secondary | ICD-10-CM

## 2015-02-03 NOTE — Progress Notes (Signed)
Pre visit review using our clinic review tool, if applicable. No additional management support is needed unless otherwise documented below in the visit note.   Chief Complaint  Patient presents with  . Pain in Buttocks  . Back Pain    HPI: Patient Melinda Harrington  comes in today for SDA for  new problem evaluation. Symptoms of discomfort and pain in her coccygeal area for about 2-3 months. Onset after sitting long  and bike riding and [place]. But no specific injury is difficult also when riding in the car. She has a history of back disease L4-L5 on the left with question radiation.Always has pain in back and hip area.   Isaias Cowman has had injections. For this . This is a new pain . Not related to bowel function or walking  isn't constipated no UTI symptoms or neurologic symptoms. Also has no fever abdominal pain with it. Has used an anti-inflammatory some gets a lot better when she's not sitting. She even treated herself for hemorrhoids in case it could be that.  She is going out of town in the near future. Certainly step for evaluation or relief.  ROS: See pertinent positives and negatives per HPI.  Past Medical History  Diagnosis Date  . H/O diverticulitis of colon   . H/O fibromyalgia   . S/P wrist surgery     left    Family History  Problem Relation Age of Onset  . Bipolar disorder      sibling  . GER disease Son   . Cancer - Other      sinus father died quickly   . Renal Disease      nephrctomy GM  . Diabetes Maternal Grandfather     History   Social History  . Marital Status: Married    Spouse Name: N/A  . Number of Children: N/A  . Years of Education: N/A   Social History Main Topics  . Smoking status: Never Smoker   . Smokeless tobacco: Not on file  . Alcohol Use: Not on file  . Drug Use: Not on file  . Sexual Activity: Not on file   Other Topics Concern  . None   Social History Narrative   ba degree    Social etoh   Non smoker    G1P1     Outpatient Prescriptions Prior to Visit  Medication Sig Dispense Refill  . progesterone (PROMETRIUM) 100 MG capsule     . amoxicillin (AMOXIL) 875 MG tablet Take 1 tablet (875 mg total) by mouth 3 (three) times daily. 30 tablet 0  . PATADAY 0.2 % SOLN     . sulfacetamide (BLEPH-10) 10 % ophthalmic solution 1 -2 drops in eyes every 4 hours for 7 days 15 mL 0   No facility-administered medications prior to visit.     EXAM:  BP 122/76 mmHg  Temp(Src) 98.3 F (36.8 C) (Oral)  Ht 5' 5.5" (1.664 m)  Wt 187 lb 9.6 oz (85.095 kg)  BMI 30.73 kg/m2  Body mass index is 30.73 kg/(m^2).  GENERAL: vitals reviewed and listed above, alert, oriented, appears well hydrated and in no acute distress HEENT: atraumatic, conjunctiva  clear, no obvious abnormalities on inspection of external nose and ears MS: moves all extremities without noticeable focal  Abnormality On palpation there is tenderness right at her coccygeal area I see no redness swelling mass and some diffuse low back tenderness left more than right. Her gait is minimally antalgic and there are no  neurovascular deficits that are obvious.  PSYCH: pleasant and cooperative, no obvious depression or anxiety  ASSESSMENT AND PLAN:  Discussed the following assessment and plan:  Coccydynia  Left-sided low back pain with left-sided sciatica - History L4-5 disc disease seen dr Jillyn Hidden  -Patient advised to return or notify health care team  if symptoms worsen ,persist or new concerns arise.  Patient Instructions  This is coccydynia  Area of pain . Poss injury  Strain but usually better in 1-2 months  Ice relative rest . Doesn't sound like referred pain cause it is so local.  Ice  antiinflammtories  Seating suppoort ( not  Donut).  Will do a referral to your o=rtho office about this   Will need imaging.  of the area  Tailbone Injury The tailbone (coccyx) is the small bone at the lower end of the spine. A tailbone injury may involve  stretched ligaments, bruising, or a broken bone (fracture). Women are more vulnerable to this injury due to having a wider pelvis. CAUSES  This type of injury typically occurs from falling and landing on the tailbone. Repeated strain or friction from actions such as rowing and bicycling may also injure the area. The tailbone can be injured during childbirth. Infections or tumors may also press on the tailbone and cause pain. Sometimes, the cause of injury is unknown. SYMPTOMS   Bruising.  Pain when sitting.  Painful bowel movements.  In women, pain during intercourse. DIAGNOSIS  Your caregiver can diagnose a tailbone injury based on your symptoms and a physical exam. X-rays may be taken if a fracture is suspected. Your caregiver may also use an MRI scan imaging test to evaluate your symptoms. TREATMENT  Your caregiver may prescribe medicines to help relieve your pain. Most tailbone injuries heal on their own in 4 to 6 weeks. However, if the injury is caused by an infection or tumor, the recovery period may vary. PREVENTION  Wear appropriate padding and sports gear when bicycling and rowing. This can help prevent an injury from repeated strain or friction. HOME CARE INSTRUCTIONS   Put ice on the injured area.  Put ice in a plastic bag.  Place a towel between your skin and the bag.  Leave the ice on for 15-20 minutes, every hour while awake for the first 1 to 2 days.  Sit on a large, rubber or inflated ring or cushion to ease your pain. Lean forward when sitting to help decrease discomfort.  Avoid sitting for long periods of time.  Increase your activity as the pain allows.  Only take over-the-counter or prescription medicines for pain, discomfort, or fever as directed by your caregiver.  You may use stool softeners if it is painful to have a bowel movement, or as directed by your caregiver.  Eat a diet with plenty of fiber to help prevent constipation.  Keep all follow-up  appointments as directed by your caregiver. SEEK MEDICAL CARE IF:   Your pain becomes worse.  Your bowel movements cause a great deal of discomfort.  You are unable to have a bowel movement.  You have a fever. MAKE SURE YOU:  Understand these instructions.  Will watch your condition.  Will get help right away if you are not doing well or get worse. Document Released: 07/30/2000 Document Revised: 10/25/2011 Document Reviewed: 02/25/2011 [facility] Patient Information 2015 [place], [place]. This information is not intended to replace advice given to you by your health care provider. Make sure you discuss any questions you have  with your health care provider.      Neta Mends. Mehar Sagen M.D.

## 2015-02-03 NOTE — Patient Instructions (Signed)
This is coccydynia  Area of pain . Poss injury  Strain but usually better in 1-2 months  Ice relative rest . Doesn't sound like referred pain cause it is so local.  Ice  antiinflammtories  Seating suppoort ( not  Donut).  Will do a referral to your o=rtho office about this   Will need imaging.  of the area  Tailbone Injury The tailbone (coccyx) is the small bone at the lower end of the spine. A tailbone injury may involve stretched ligaments, bruising, or a broken bone (fracture). Women are more vulnerable to this injury due to having a wider pelvis. CAUSES  This type of injury typically occurs from falling and landing on the tailbone. Repeated strain or friction from actions such as rowing and bicycling may also injure the area. The tailbone can be injured during childbirth. Infections or tumors may also press on the tailbone and cause pain. Sometimes, the cause of injury is unknown. SYMPTOMS   Bruising.  Pain when sitting.  Painful bowel movements.  In women, pain during intercourse. DIAGNOSIS  Your caregiver can diagnose a tailbone injury based on your symptoms and a physical exam. X-rays may be taken if a fracture is suspected. Your caregiver may also use an MRI scan imaging test to evaluate your symptoms. TREATMENT  Your caregiver may prescribe medicines to help relieve your pain. Most tailbone injuries heal on their own in 4 to 6 weeks. However, if the injury is caused by an infection or tumor, the recovery period may vary. PREVENTION  Wear appropriate padding and sports gear when bicycling and rowing. This can help prevent an injury from repeated strain or friction. HOME CARE INSTRUCTIONS   Put ice on the injured area.  Put ice in a plastic bag.  Place a towel between your skin and the bag.  Leave the ice on for 15-20 minutes, every hour while awake for the first 1 to 2 days.  Sit on a large, rubber or inflated ring or cushion to ease your pain. Lean forward when sitting  to help decrease discomfort.  Avoid sitting for long periods of time.  Increase your activity as the pain allows.  Only take over-the-counter or prescription medicines for pain, discomfort, or fever as directed by your caregiver.  You may use stool softeners if it is painful to have a bowel movement, or as directed by your caregiver.  Eat a diet with plenty of fiber to help prevent constipation.  Keep all follow-up appointments as directed by your caregiver. SEEK MEDICAL CARE IF:   Your pain becomes worse.  Your bowel movements cause a great deal of discomfort.  You are unable to have a bowel movement.  You have a fever. MAKE SURE YOU:  Understand these instructions.  Will watch your condition.  Will get help right away if you are not doing well or get worse. Document Released: 07/30/2000 Document Revised: 10/25/2011 Document Reviewed: 02/25/2011 [facility] Patient Information 2015 [place], [place]. This information is not intended to replace advice given to you by your health care provider. Make sure you discuss any questions you have with your health care provider.

## 2015-09-22 ENCOUNTER — Other Ambulatory Visit: Payer: Self-pay

## 2015-09-22 DIAGNOSIS — Z1231 Encounter for screening mammogram for malignant neoplasm of breast: Secondary | ICD-10-CM

## 2015-11-06 ENCOUNTER — Ambulatory Visit
Admission: RE | Admit: 2015-11-06 | Discharge: 2015-11-06 | Disposition: A | Discharge status: Home / Self Care | Payer: Managed Care, Other (non HMO) | Source: Ambulatory Visit

## 2015-11-06 DIAGNOSIS — Z1231 Encounter for screening mammogram for malignant neoplasm of breast: Secondary | ICD-10-CM

## 2016-11-02 ENCOUNTER — Other Ambulatory Visit: Payer: Self-pay | Admitting: Obstetrics and Gynecology

## 2016-11-02 DIAGNOSIS — Z1231 Encounter for screening mammogram for malignant neoplasm of breast: Secondary | ICD-10-CM

## 2016-11-18 ENCOUNTER — Ambulatory Visit
Admission: RE | Admit: 2016-11-18 | Discharge: 2016-11-18 | Disposition: A | Discharge status: Home / Self Care | Payer: Managed Care, Other (non HMO) | Source: Ambulatory Visit | Attending: Obstetrics and Gynecology | Admitting: Obstetrics and Gynecology

## 2016-11-18 DIAGNOSIS — Z1231 Encounter for screening mammogram for malignant neoplasm of breast: Secondary | ICD-10-CM

## 2017-08-10 ENCOUNTER — Ambulatory Visit (INDEPENDENT_AMBULATORY_CARE_PROVIDER_SITE_OTHER): Payer: Managed Care, Other (non HMO) | Admitting: Adult Health

## 2017-08-10 ENCOUNTER — Ambulatory Visit (INDEPENDENT_AMBULATORY_CARE_PROVIDER_SITE_OTHER)
Admission: RE | Admit: 2017-08-10 | Discharge: 2017-08-10 | Disposition: A | Discharge status: Home / Self Care | Payer: Managed Care, Other (non HMO) | Source: Ambulatory Visit | Attending: Adult Health | Admitting: Adult Health

## 2017-08-10 ENCOUNTER — Telehealth: Payer: Self-pay | Admitting: Adult Health

## 2017-08-10 ENCOUNTER — Encounter: Payer: Self-pay | Admitting: Adult Health

## 2017-08-10 VITALS — BP 126/80 | Temp 98.8°F | Wt 193.0 lb

## 2017-08-10 DIAGNOSIS — R05 Cough: Secondary | ICD-10-CM | POA: Diagnosis not present

## 2017-08-10 DIAGNOSIS — J0141 Acute recurrent pansinusitis: Secondary | ICD-10-CM | POA: Diagnosis not present

## 2017-08-10 DIAGNOSIS — R059 Cough, unspecified: Secondary | ICD-10-CM

## 2017-08-10 MED ORDER — FLUTICASONE PROPIONATE 50 MCG/ACT NA SUSP: 2.0000 | Freq: Every day | NASAL | 6 refills | Status: DC

## 2017-08-10 MED ORDER — DOXYCYCLINE HYCLATE 100 MG PO CAPS: 100.0000 mg | ORAL_CAPSULE | Freq: Two times a day (BID) | ORAL | 0 refills | Status: DC

## 2017-08-10 MED ORDER — HYDROCODONE-HOMATROPINE 5-1.5 MG/5ML PO SYRP
5.0000 mL | ORAL_SOLUTION | Freq: Three times a day (TID) | ORAL | 0 refills | Status: DC | PRN
Start: 2017-08-10 — End: 2018-01-23

## 2017-08-10 NOTE — Telephone Encounter (Signed)
Updated patient on chest x ray result. All questions answered

## 2017-08-10 NOTE — Progress Notes (Signed)
   Subjective:    Patient ID: Melinda Harrington, female    DOB: 01/13/1961, 56 y.o.   MRN: [id number]  URI   This is a new problem. The current episode started 1 to 4 weeks ago (2 days ). The problem has been gradually worsening. There has been no fever. Associated symptoms include congestion, coughing (productive cough with mucus with  blood ), diarrhea, headaches, rhinorrhea, sinus pain and a sore throat. Pertinent negatives include no chest pain, ear pain, nausea or wheezing. She has tried acetaminophen (Mucinex and Nyquil ) for the symptoms. The treatment provided mild relief.      Review of Systems  Constitutional: Positive for activity change and fatigue. Negative for chills and fever.  HENT: Positive for congestion, nosebleeds, rhinorrhea, sinus pain and sore throat. Negative for ear pain.   Eyes: Negative.   Respiratory: Positive for cough (productive cough with mucus with  blood ). Negative for wheezing.   Cardiovascular: Negative for chest pain.  Gastrointestinal: Positive for diarrhea. Negative for nausea.  Neurological: Positive for headaches.  All other systems reviewed and are negative.      Objective:   Physical Exam  Constitutional: She is oriented to person, place, and time. She appears well-developed and well-nourished. No distress.  HENT:  Head: Normocephalic and atraumatic.  Right Ear: Hearing, tympanic membrane, external ear and ear canal normal.  Left Ear: Hearing, tympanic membrane, external ear and ear canal normal.  Nose: Mucosal edema and rhinorrhea present. No nose lacerations, sinus tenderness or nasal deformity. Right sinus exhibits maxillary sinus tenderness and frontal sinus tenderness. Left sinus exhibits maxillary sinus tenderness and frontal sinus tenderness.  Mouth/Throat: Mucous membranes are normal. Oropharyngeal exudate present.  Neck: Normal range of motion. Neck supple.  Cardiovascular: Normal rate, regular rhythm, normal heart sounds and  intact distal pulses. Exam reveals no gallop and no friction rub.  No murmur heard. Pulmonary/Chest: Effort normal and breath sounds normal. No respiratory distress. She has no wheezes. She has no rales. She exhibits no tenderness.  Lymphadenopathy:    She has no cervical adenopathy.  Neurological: She is alert and oriented to person, place, and time.  Skin: Skin is warm and dry. She is not diaphoretic. No erythema. No pallor.  Psychiatric: She has a normal mood and affect. Her behavior is normal. Judgment and thought content normal.  Nursing note and vitals reviewed.     Assessment & Plan:  1. Acute recurrent pansinusitis - doxycycline (VIBRAMYCIN) 100 MG capsule; Take 1 capsule (100 mg total) by mouth 2 (two) times daily.  Dispense: 14 capsule; Refill: 0 - fluticasone (FLONASE) 50 MCG/ACT nasal spray; Place 2 sprays into both nostrils daily.  Dispense: 16 g; Refill: 6 - Rest and stay hydrated  - Follow up if no improvement in the next 2-3 days or sooner if needed   2. Cough - Lung clear but will get a chest x ray due to complaint of "coughing up blood" - DG Chest 2 View; Future - HYDROcodone-homatropine (HYCODAN) 5-1.5 MG/5ML syrup; Take 5 mLs by mouth every 8 (eight) hours as needed for cough.  Dispense: 120 mL; Refill: 0  Shirline Freesory Danial Sisley, NP

## 2018-01-23 ENCOUNTER — Ambulatory Visit (INDEPENDENT_AMBULATORY_CARE_PROVIDER_SITE_OTHER): Payer: Managed Care, Other (non HMO) | Admitting: Family Medicine

## 2018-01-23 ENCOUNTER — Encounter: Payer: Self-pay | Admitting: Family Medicine

## 2018-01-23 VITALS — BP 112/80 | HR 83 | Temp 98.3°F | Ht 65.5 in | Wt 181.2 lb

## 2018-01-23 DIAGNOSIS — J209 Acute bronchitis, unspecified: Secondary | ICD-10-CM

## 2018-01-23 MED ORDER — AZITHROMYCIN 250 MG PO TABS: ORAL_TABLET | ORAL | 0 refills | Status: DC

## 2018-01-23 NOTE — Progress Notes (Signed)
   Subjective:    Patient ID: Melinda Harrington, female    DOB: 01/09/1961, 57 y.o.   MRN: [id number]  HPI Here for 3 days of sinus congestion, PND, hoarseness, ST, chest tightness and coughing up yellow sputum. On Mucinex.    Review of Systems  Constitutional: Negative.   HENT: Positive for congestion, postnasal drip, sinus pressure, sore throat and voice change. Negative for sinus pain.   Eyes: Negative.   Respiratory: Positive for cough.        Objective:   Physical Exam  Constitutional: She appears well-developed and well-nourished.  HENT:  Right Ear: External ear normal.  Left Ear: External ear normal.  Nose: Nose normal.  Mouth/Throat: Oropharynx is clear and moist.  Eyes: Conjunctivae are normal.  Neck: No thyromegaly present.  Pulmonary/Chest: Effort normal. No stridor. No respiratory distress. She has no wheezes. She has no rales.  Scattered rhonchi   Lymphadenopathy:    She has no cervical adenopathy.          Assessment & Plan:  Bronchitis, treat with a Zpack. Add Delsym prn.  Gershon CraneStephen Fry, MD

## 2018-02-03 ENCOUNTER — Telehealth: Payer: Self-pay | Admitting: Internal Medicine

## 2018-02-03 NOTE — Telephone Encounter (Signed)
Copied from CRM #[id number]. Topic: Quick Communication - See Telephone Encounter >> Feb 03, 2018  4:23 PM Terisa Starraylor, Brittany L wrote: CRM for notification. See Telephone encounter for: 02/03/18.  Patient states she was in the office on 6/10. She said that she still feels bad. She is still coughing up stuff. She thinks she needs an additional round of antibiotics. [place] I advised her I would send a message but I am not sure if this could be done.   Please call back @ [phone]

## 2018-02-06 NOTE — Telephone Encounter (Signed)
Left message for patient to return call.  Verify pharmacy and get update on symptoms

## 2018-02-07 MED ORDER — AMOXICILLIN-POT CLAVULANATE 875-125 MG PO TABS: 1.0000 | ORAL_TABLET | Freq: Two times a day (BID) | ORAL | 0 refills | Status: DC

## 2018-02-07 NOTE — Telephone Encounter (Signed)
[facility]

## 2018-02-07 NOTE — Telephone Encounter (Signed)
Call in Augmentin 875 bid for 10 days  

## 2018-02-07 NOTE — Telephone Encounter (Signed)
Pt states that she still feels groggy Sore throat, feels like she has a lump in her throat Still coughing up light Renn Stille mucous Pt is still coughing at times but this has improved.  Pt still using Delsym PRN and cough drops.  Denies fever  Pt saw Dr Clent RidgesFry on 01/23/18 Will send back to him for recommendations as Dr Fabian SharpPanosh is not in office today.   Assessment & Plan: 01/23/18  Bronchitis, treat with a Zpack. Add Delsym prn.  Gershon CraneStephen Fry, MD   [facility] (loaded in chart)

## 2018-02-07 NOTE — Telephone Encounter (Signed)
Called pt and left a VM to call back to confirm pharmacy.  [facility]

## 2018-02-07 NOTE — Telephone Encounter (Signed)
LM for patient

## 2018-02-13 ENCOUNTER — Other Ambulatory Visit: Payer: Self-pay | Admitting: Obstetrics and Gynecology

## 2018-02-13 DIAGNOSIS — Z1231 Encounter for screening mammogram for malignant neoplasm of breast: Secondary | ICD-10-CM

## 2018-03-13 ENCOUNTER — Ambulatory Visit: Payer: Managed Care, Other (non HMO)

## 2018-07-10 NOTE — Progress Notes (Signed)
Chief Complaint  Patient presents with  . Gastroesophageal Reflux    worsening reflux, OTC treatment is not helping. Sore throat all the time.     HPI: Melinda Harrington 57 y.o. come in for worsening  Problem  Last seen by ne in 2016  Sour taste in   Throat after eating .and burning  upper throat  chest   Yesterday had chili andfood   Came up and vomited  "projectile"    Has had sx   On and off  For couple years  But now  for 4-5 months having sx   Every day .  Has teakne otc without resolution or control recnetly   pepcid bid,  nexium in past  Getting a cold   Begins with sore throat.    Takes aleve frequently .  For back but no dysphagia or abd pain with this  Notes  If has hard boiled egg causes problem  But no other dysphagia .  Has dec caffiene  But still  Has red wine ( aggravator)   tomato flares  Reports  concern  With recurrent bronchitis since had    Flu shot last year    No fever pna .   ROS: See pertinent positives and negatives per HPI. No weight loss fever no strep exposure today but says getting a cold cause of  St.  Past Medical History:  Diagnosis Date  . H/O diverticulitis of colon   . H/O fibromyalgia   . S/P wrist surgery    left    Family History  Problem Relation Age of Onset  . Bipolar disorder Unknown        sibling  . GER disease Son   . Cancer - Other Unknown        sinus father died quickly   . Renal Disease Unknown        nephrctomy GM  . Diabetes Maternal Grandfather     Social History   Socioeconomic History  . Marital status: Married    Spouse name: Not on file  . Number of children: Not on file  . Years of education: Not on file  . Highest education level: Not on file  Occupational History  . Not on file  Social Needs  . Financial resource strain: Not on file  . Food insecurity:    Worry: Not on file    Inability: Not on file  . Transportation needs:    Medical: Not on file    Non-medical: Not on file  Tobacco Use  .  Smoking status: Never Smoker  . Smokeless tobacco: Never Used  Substance and Sexual Activity  . Alcohol use: Not on file  . Drug use: Not on file  . Sexual activity: Not on file  Lifestyle  . Physical activity:    Days per week: Not on file    Minutes per session: Not on file  . Stress: Not on file  Relationships  . Social connections:    Talks on phone: Not on file    Gets together: Not on file    Attends religious service: Not on file    Active member of club or organization: Not on file    Attends meetings of clubs or organizations: Not on file    Relationship status: Not on file  Other Topics Concern  . Not on file  Social History Narrative   ba degree    Social etoh   Non smoker    G1P1  Outpatient Medications Prior to Visit  Medication Sig Dispense Refill  . famotidine (PEPCID) 20 MG tablet Take 20 mg by mouth 2 (two) times daily.    . Naproxen Sod-diphenhydrAMINE (ALEVE PM) 220-25 MG TABS Take by mouth as needed.    . naproxen sodium (ALEVE) 220 MG tablet Take 220 mg by mouth daily as needed.    Marland Kitchen acetaminophen (TYLENOL) 325 MG tablet Take 650 mg by mouth every 6 (six) hours as needed.    Marland Kitchen amoxicillin-clavulanate (AUGMENTIN) 875-125 MG tablet Take 1 tablet by mouth 2 (two) times daily. (Patient not taking: Reported on 07/11/2018) 20 tablet 0  . azithromycin (ZITHROMAX) 250 MG tablet As directed (Patient not taking: Reported on 07/11/2018) 6 tablet 0  . fluticasone (FLONASE) 50 MCG/ACT nasal spray Place 2 sprays into both nostrils daily. (Patient not taking: Reported on 01/23/2018) 16 g 6  . guaiFENesin (MUCINEX) 600 MG 12 hr tablet Take by mouth 2 (two) times daily.     No facility-administered medications prior to visit.      EXAM:  BP 136/82 (BP Location: Left Arm, Patient Position: Sitting, Cuff Size: Normal)   Pulse 83   Temp (!) 97.5 F (36.4 C) (Oral)   Wt 190 lb 1.6 oz (86.2 kg)   SpO2 98%   BMI 31.15 kg/m   Body mass index is 31.15  kg/m.  GENERAL: vitals reviewed and listed above, alert, oriented, appears well hydrated and in no acute distress  Frequent throat clearing   HEENT: atraumatic, conjunctiva  clear, no obvious abnormalities on inspection of external nose and ears OP : no lesion edema or exudate  Pink red no exudate   NECK: no obvious masses on inspection palpation  No adenopathy  LUNGS: clear to auscultation bilaterally, no wheezes, rales or rhonchi, good air movement CV: HRRR, no clubbing cyanosis or  peripheral edema nl cap refill  Abdomen:  Sof,t normal bowel sounds without hepatosplenomegaly, no guarding rebound or masses no CVA tenderness  MS: moves all extremities without noticeable focal  abnormality PSYCH: pleasant and cooperative, no obvious depression or anxiety  BP Readings from Last 3 Encounters:  07/11/18 136/82  01/23/18 112/80  08/10/17 126/80   Wt Readings from Last 3 Encounters:  07/11/18 190 lb 1.6 oz (86.2 kg)  01/23/18 181 lb 3.2 oz (82.2 kg)  08/10/17 193 lb (87.5 kg)     ASSESSMENT AND PLAN:  Discussed the following assessment and plan:  Gastroesophageal reflux disease, esophagitis presence not specified  Waterbrash symptom  Sore throat  Medication management Progressive sx    Not responsive to  Med and  Diet changes   Add protonix for now  Consider Carafate    She should make appt with her GI ( dr Randa Evens did her colon last) consideration endoscopy   Doubt if she has strep throat t( low risk) oday without exposure   Offered but will wait  To see how  progresses -Patient advised to return or notify health care team  if  new concerns arise.  Patient Instructions    Because you have reflex sx unresponsive to most medication   I want you s[to see you GI doc  Make appt with Dr Randa Evens and I will send notes.   Contact us if problems getting the appt.   In interim continued  Dietary measures  And add   protonix   rx every day .   Throat clearing and sore throats  Can be  a sx of  Reflux to  the  Vocal chords.  Stop all vitamins  And fish oil type supplements incase adding to the problem    Food Choices for Gastroesophageal Reflux Disease, Adult When you have gastroesophageal reflux disease (GERD), the foods you eat and your eating habits are very important. Choosing the right foods can help ease the discomfort of GERD. Consider working with a diet and nutrition specialist (dietitian) to help you make healthy food choices. What general guidelines should I follow? Eating plan  Choose healthy foods low in fat, such as fruits, vegetables, whole grains, low-fat dairy products, and lean meat, fish, and poultry.  Eat frequent, small meals instead of three large meals each day. Eat your meals slowly, in a relaxed setting. Avoid bending over or lying down until 2-3 hours after eating.  Limit high-fat foods such as fatty meats or fried foods.  Limit your intake of oils, butter, and shortening to less than 8 teaspoons each day.  Avoid the following: ? Foods that cause symptoms. These may be different for different people. Keep a food diary to keep track of foods that cause symptoms. ? Alcohol. ? Drinking large amounts of liquid with meals. ? Eating meals during the 2-3 hours before bed.  Cook foods using methods other than frying. This may include baking, grilling, or broiling. Lifestyle   Maintain a healthy weight. Ask your health care provider what weight is healthy for you. If you need to lose weight, work with your health care provider to do so safely.  Exercise for at least 30 minutes on 5 or more days each week, or as told by your health care provider.  Avoid wearing clothes that fit tightly around your waist and chest.  Do not use any products that contain nicotine or tobacco, such as cigarettes and e-cigarettes. If you need help quitting, ask your health care provider.  Sleep with the head of your bed raised. Use a wedge under the mattress or blocks  under the bed frame to raise the head of the bed. What foods are not recommended? The items listed may not be a complete list. Talk with your dietitian about what dietary choices are best for you. Grains Pastries or quick breads with added fat. [place] toast. Vegetables Deep fried vegetables. [place] fries. Any vegetables prepared with added fat. Any vegetables that cause symptoms. For some people this may include tomatoes and tomato products, chili peppers, onions and garlic, and horseradish. Fruits Any fruits prepared with added fat. Any fruits that cause symptoms. For some people this may include citrus fruits, such as oranges, grapefruit, pineapple, and lemons. Meats and other protein foods High-fat meats, such as fatty beef or pork, hot dogs, ribs, ham, sausage, salami and bacon. Fried meat or protein, including fried fish and fried chicken. Nuts and nut butters. Dairy Whole milk and chocolate milk. Sour cream. Cream. Ice cream. Cream cheese. Milk shakes. Beverages Coffee and tea, with or without caffeine. Carbonated beverages. Sodas. Energy drinks. Fruit juice made with acidic fruits (such as orange or grapefruit). Tomato juice. Alcoholic drinks. Fats and oils Butter. Margarine. Shortening. Ghee. Sweets and desserts Chocolate and cocoa. Donuts. Seasoning and other foods Pepper. Peppermint and spearmint. Any condiments, herbs, or seasonings that cause symptoms. For some people, this may include curry, hot sauce, or vinegar-based salad dressings. Summary  When you have gastroesophageal reflux disease (GERD), food and lifestyle choices are very important to help ease the discomfort of GERD.  Eat frequent, small meals instead of three large meals each day.  Eat your meals slowly, in a relaxed setting. Avoid bending over or lying down until 2-3 hours after eating.  Limit high-fat foods such as fatty meat or fried foods. This information is not intended to replace advice given to you by your  health care provider. Make sure you discuss any questions you have with your health care provider. Document Released: 08/02/2005 Document Revised: 08/03/2016 Document Reviewed: 08/03/2016 Elsevier Interactive Patient Education  2018 [employer]     UplandWanda K.  M.D.

## 2018-07-11 ENCOUNTER — Ambulatory Visit (INDEPENDENT_AMBULATORY_CARE_PROVIDER_SITE_OTHER): Payer: Managed Care, Other (non HMO) | Admitting: Internal Medicine

## 2018-07-11 ENCOUNTER — Encounter: Payer: Self-pay | Admitting: Internal Medicine

## 2018-07-11 VITALS — BP 136/82 | HR 83 | Temp 97.5°F | Wt 190.1 lb

## 2018-07-11 DIAGNOSIS — K219 Gastro-esophageal reflux disease without esophagitis: Secondary | ICD-10-CM

## 2018-07-11 DIAGNOSIS — J029 Acute pharyngitis, unspecified: Secondary | ICD-10-CM | POA: Diagnosis not present

## 2018-07-11 DIAGNOSIS — Z79899 Other long term (current) drug therapy: Secondary | ICD-10-CM

## 2018-07-11 DIAGNOSIS — R12 Heartburn: Secondary | ICD-10-CM | POA: Diagnosis not present

## 2018-07-11 MED ORDER — PANTOPRAZOLE SODIUM 40 MG PO TBEC: 40.0000 mg | DELAYED_RELEASE_TABLET | Freq: Every day | ORAL | 1 refills | Status: DC

## 2018-07-11 NOTE — Patient Instructions (Addendum)
Because you have reflex sx unresponsive to most medication   I want you s[to see you GI doc  Make appt with Dr Randa EvensEdwards and I will send notes.   Contact us if problems getting the appt.   In interim continued  Dietary measures  And add   protonix   rx every day .   Throat clearing and sore throats  Can be a sx of  Reflux to the  Vocal chords.  Stop all vitamins  And fish oil type supplements incase adding to the problem    Food Choices for Gastroesophageal Reflux Disease, Adult When you have gastroesophageal reflux disease (GERD), the foods you eat and your eating habits are very important. Choosing the right foods can help ease the discomfort of GERD. Consider working with a diet and nutrition specialist (dietitian) to help you make healthy food choices. What general guidelines should I follow? Eating plan  Choose healthy foods low in fat, such as fruits, vegetables, whole grains, low-fat dairy products, and lean meat, fish, and poultry.  Eat frequent, small meals instead of three large meals each day. Eat your meals slowly, in a relaxed setting. Avoid bending over or lying down until 2-3 hours after eating.  Limit high-fat foods such as fatty meats or fried foods.  Limit your intake of oils, butter, and shortening to less than 8 teaspoons each day.  Avoid the following: ? Foods that cause symptoms. These may be different for different people. Keep a food diary to keep track of foods that cause symptoms. ? Alcohol. ? Drinking large amounts of liquid with meals. ? Eating meals during the 2-3 hours before bed.  Cook foods using methods other than frying. This may include baking, grilling, or broiling. Lifestyle   Maintain a healthy weight. Ask your health care provider what weight is healthy for you. If you need to lose weight, work with your health care provider to do so safely.  Exercise for at least 30 minutes on 5 or more days each week, or as told by your health care  provider.  Avoid wearing clothes that fit tightly around your waist and chest.  Do not use any products that contain nicotine or tobacco, such as cigarettes and e-cigarettes. If you need help quitting, ask your health care provider.  Sleep with the head of your bed raised. Use a wedge under the mattress or blocks under the bed frame to raise the head of the bed. What foods are not recommended? The items listed may not be a complete list. Talk with your dietitian about what dietary choices are best for you. Grains Pastries or quick breads with added fat. [place] toast. Vegetables Deep fried vegetables. [place] fries. Any vegetables prepared with added fat. Any vegetables that cause symptoms. For some people this may include tomatoes and tomato products, chili peppers, onions and garlic, and horseradish. Fruits Any fruits prepared with added fat. Any fruits that cause symptoms. For some people this may include citrus fruits, such as oranges, grapefruit, pineapple, and lemons. Meats and other protein foods High-fat meats, such as fatty beef or pork, hot dogs, ribs, ham, sausage, salami and bacon. Fried meat or protein, including fried fish and fried chicken. Nuts and nut butters. Dairy Whole milk and chocolate milk. Sour cream. Cream. Ice cream. Cream cheese. Milk shakes. Beverages Coffee and tea, with or without caffeine. Carbonated beverages. Sodas. Energy drinks. Fruit juice made with acidic fruits (such as orange or grapefruit). Tomato juice. Alcoholic drinks. Fats and  oils Butter. Margarine. Shortening. Ghee. Sweets and desserts Chocolate and cocoa. Donuts. Seasoning and other foods Pepper. Peppermint and spearmint. Any condiments, herbs, or seasonings that cause symptoms. For some people, this may include curry, hot sauce, or vinegar-based salad dressings. Summary  When you have gastroesophageal reflux disease (GERD), food and lifestyle choices are very important to help ease the  discomfort of GERD.  Eat frequent, small meals instead of three large meals each day. Eat your meals slowly, in a relaxed setting. Avoid bending over or lying down until 2-3 hours after eating.  Limit high-fat foods such as fatty meat or fried foods. This information is not intended to replace advice given to you by your health care provider. Make sure you discuss any questions you have with your health care provider. Document Released: 08/02/2005 Document Revised: 08/03/2016 Document Reviewed: 08/03/2016 Elsevier Interactive Patient Education  [employer].

## 2018-07-28 ENCOUNTER — Other Ambulatory Visit: Payer: Self-pay | Admitting: Gastroenterology

## 2018-07-28 DIAGNOSIS — R112 Nausea with vomiting, unspecified: Secondary | ICD-10-CM

## 2018-07-31 ENCOUNTER — Ambulatory Visit
Admission: RE | Admit: 2018-07-31 | Discharge: 2018-07-31 | Disposition: A | Discharge status: Home / Self Care | Payer: Managed Care, Other (non HMO) | Source: Ambulatory Visit | Attending: Gastroenterology | Admitting: Gastroenterology

## 2018-07-31 DIAGNOSIS — R112 Nausea with vomiting, unspecified: Secondary | ICD-10-CM

## 2018-09-01 ENCOUNTER — Other Ambulatory Visit: Payer: Self-pay | Admitting: Internal Medicine

## 2019-02-07 ENCOUNTER — Other Ambulatory Visit: Payer: Self-pay | Admitting: Internal Medicine

## 2019-05-03 ENCOUNTER — Other Ambulatory Visit: Payer: Self-pay | Admitting: Internal Medicine

## 2019-06-22 ENCOUNTER — Telehealth: Payer: Self-pay

## 2019-06-22 NOTE — Telephone Encounter (Signed)
NOTES ON FILE FROM   DR GRETCHEN ADKINS [phone], SENT REFERRAL TO SCHEDULING 

## 2019-07-05 ENCOUNTER — Ambulatory Visit: Payer: Managed Care, Other (non HMO) | Admitting: Cardiology

## 2019-07-30 DIAGNOSIS — Z7189 Other specified counseling: Secondary | ICD-10-CM | POA: Insufficient documentation

## 2019-07-30 DIAGNOSIS — R002 Palpitations: Secondary | ICD-10-CM | POA: Insufficient documentation

## 2019-07-30 NOTE — Progress Notes (Signed)
Cardiology Office Note   Date:  07/31/2019   ID:  Melinda Harrington, DOB [birth year], MRN [id number]  PCP:  Madelin Headings, MD  Cardiologist:   No primary care provider on file. Referring:  Madelin Headings, MD  Chief Complaint  Patient presents with  . Palpitations      History of Present Illness: Melinda Harrington is a 58 y.o. female who is referred by Panosh, Neta Mends, MD for evaluation of palpitations.  She is noticed palpitations periodically for about a year.  It happens when she is not doing anything exerting.  It does not happen with exercise.  Happens sporadically.  She describes skipped beats.  It might be 2 or 3 in a row.  It might happen several times and then go away for days or even a few weeks.  She does not know the trigger and she cannot bring it on.  She thought that maybe bending over and coming back up might do it.  She is not had any frank presyncope or syncope with this.  She did have an episode 2 years ago when out walking [place] the beach when she became dizzy and had to sit down for about 1/2-hour.  However, she is not describing palpitations with that event.  She otherwise walks for exercise.  She might get some shortness of breath with activity.  She denies any chest pressure, neck or arm discomfort.  There is been no PND or orthopnea.   Past Medical History:  Diagnosis Date  . H/O diverticulitis of colon   . H/O fibromyalgia     Past Surgical History:  Procedure Laterality Date  . ovarian cyst removal  86  . SURGERY OF LIP  2007   removal of lump  . TONSILLECTOMY       Current Outpatient Medications  Medication Sig Dispense Refill  . naproxen sodium (ALEVE) 220 MG tablet Take 220 mg by mouth daily as needed.    . pantoprazole (PROTONIX) 40 MG tablet TAKE 1 TABLET BY MOUTH EVERY DAY (Patient taking differently: Take 40 mg by mouth as directed. ) 90 tablet 0   No current facility-administered medications for this visit.    Allergies:    Aspirin    Social History:  The patient  reports that she has never smoked. She has never used smokeless tobacco.   Family History:  The patient's family history includes Atrial fibrillation in her mother; Bipolar disorder in an other family member; Cancer in her father; Cancer - Other in an other family member; Dementia in her mother; Diabetes in her maternal grandfather; GER disease in her son; Renal Disease in an other family member.    ROS:  Please see the history of present illness.   Otherwise, review of systems are positive for none.   All other systems are reviewed and negative.    PHYSICAL EXAM: VS:  BP (!) 152/86   Pulse 84   Ht 5\' 6"  (1.676 m)   Wt 194 lb 6.4 oz (88.2 kg)   BMI 31.38 kg/m  , BMI Body mass index is 31.38 kg/m. GENERAL:  Well appearing HEENT:  Pupils equal round and reactive, fundi not visualized, oral mucosa unremarkable NECK:  No jugular venous distention, waveform within normal limits, carotid upstroke brisk and symmetric, no bruits, no thyromegaly LYMPHATICS:  No cervical, inguinal adenopathy LUNGS:  Clear to auscultation bilaterally BACK:  No CVA tenderness CHEST:  Unremarkable HEART:  PMI not displaced or sustained,S1 and S2  within normal limits, no S3, no S4, no clicks, no rubs, soft brief apical systolic murmur, no diastolic murmurs ABD:  Flat, positive bowel sounds normal in frequency in pitch, no bruits, no rebound, no guarding, no midline pulsatile mass, no hepatomegaly, no splenomegaly EXT:  2 plus pulses throughout, no edema, no cyanosis no clubbing SKIN:  No rashes no nodules NEURO:  Cranial nerves II through XII grossly intact, motor grossly intact throughout PSYCH:  Cognitively intact, oriented to person place and time    EKG:  EKG is ordered today. The ekg ordered today demonstrates sinus rhythm, rate 80, short PR interval, premature atrial contraction, poor anterior R wave progression, low voltage in the limb leads, no acute ST-T wave  changes, nonspecific T wave flattening.   Recent Labs: No results found for requested labs within last 8760 hours.    Lipid Panel    Component Value Date/Time   CHOL 173 09/02/2010 0949   TRIG 78.0 09/02/2010 0949   HDL 50.00 09/02/2010 0949   CHOLHDL 3 09/02/2010 0949   VLDL 15.6 09/02/2010 0949   LDLCALC 107 (H) 09/02/2010 0949      Wt Readings from Last 3 Encounters:  07/31/19 194 lb 6.4 oz (88.2 kg)  07/31/19 190 lb (86.2 kg)  07/11/18 190 lb 1.6 oz (86.2 kg)      Other studies Reviewed: Additional studies/ records that were reviewed today include: Labs. Review of the above records demonstrates:  Please see elsewhere in the note.     ASSESSMENT AND PLAN:  PALPITATIONS:   We talked about this at length.  She has PACs on her EKG.  She is going to get a wearable perhaps an Apple watch.  She had normal electrolytes to include TSH.  I will check an echocardiogram given the borderline EKG and very soft murmur.  However, if this is unremarkable and she has no change in her symptoms then she would prefer to manage this conservatively without further treatment.  SOB: She does have some mild dyspnea on exertion.  I will order a coronary calcium score and have a low threshold for treadmill testing.  HTN: Blood pressure was initially high but came down.  She can keep an eye on this at home.  No change in therapy.  COVID EDUCATION: We talked about the vaccine.  Current medicines are reviewed at length with the patient today.  The patient does not have concerns regarding medicines.  The following changes have been made:  no change  Labs/ tests ordered today include:   Orders Placed This Encounter  Procedures  . CT CARDIAC SCORING  . EKG 12-Lead  . ECHOCARDIOGRAM COMPLETE     Disposition:   FU with me as needed.     Signed, Minus Breeding, MD  07/31/2019 3:11 PM    [facility]

## 2019-07-31 ENCOUNTER — Ambulatory Visit (INDEPENDENT_AMBULATORY_CARE_PROVIDER_SITE_OTHER): Payer: Managed Care, Other (non HMO) | Admitting: Sports Medicine

## 2019-07-31 ENCOUNTER — Ambulatory Visit
Admission: RE | Admit: 2019-07-31 | Discharge: 2019-07-31 | Disposition: A | Discharge status: Home / Self Care | Payer: Managed Care, Other (non HMO) | Source: Ambulatory Visit | Attending: Sports Medicine | Admitting: Sports Medicine

## 2019-07-31 ENCOUNTER — Ambulatory Visit (INDEPENDENT_AMBULATORY_CARE_PROVIDER_SITE_OTHER): Payer: Managed Care, Other (non HMO) | Admitting: Cardiology

## 2019-07-31 ENCOUNTER — Other Ambulatory Visit: Payer: Self-pay

## 2019-07-31 ENCOUNTER — Encounter: Payer: Self-pay | Admitting: Sports Medicine

## 2019-07-31 ENCOUNTER — Encounter: Payer: Self-pay | Admitting: Cardiology

## 2019-07-31 VITALS — BP 152/86 | HR 84 | Ht 66.0 in | Wt 194.4 lb

## 2019-07-31 VITALS — BP 138/66 | Ht 66.0 in | Wt 190.0 lb

## 2019-07-31 DIAGNOSIS — M5416 Radiculopathy, lumbar region: Secondary | ICD-10-CM

## 2019-07-31 DIAGNOSIS — R0609 Other forms of dyspnea: Secondary | ICD-10-CM

## 2019-07-31 DIAGNOSIS — R0600 Dyspnea, unspecified: Secondary | ICD-10-CM | POA: Diagnosis not present

## 2019-07-31 DIAGNOSIS — R002 Palpitations: Secondary | ICD-10-CM | POA: Diagnosis not present

## 2019-07-31 DIAGNOSIS — M25572 Pain in left ankle and joints of left foot: Secondary | ICD-10-CM

## 2019-07-31 DIAGNOSIS — Z7189 Other specified counseling: Secondary | ICD-10-CM

## 2019-07-31 NOTE — Progress Notes (Signed)
PCP: Burnis Medin, MD  Subjective:   HPI: Patient is a 58 y.o. female here for evaluation of left foot pain as well as low back pain.  Patient notes the pain in her low back is been present for the last year and a half.  The pain starts in her left hip and radiates down the posterior aspect of her leg and down into her lateral aspect of her foot.  She also has numbness and tingling in the same distribution.  Patient denies any bruising or swelling.  She denies any injury or trauma that preceded the pain.  Patient has not done anything for the pain other than take over-the-counter anti-inflammatories but this has not helped significantly.  Patient notes the pain is a sharp stabbing quality to it.  The pain will worsen with prolonged sitting as well as with extension activities of her back.  Patient also notes pain in the lateral aspect of her foot.  Initially she was diagnosed with plantar fasciitis and she was having more pain on the plantar aspect of her foot however that pain slowly resolved with treatment is now having pain more on the lateral aspect of her foot.  She denies any injury or trauma to her foot.  She denies any bruising or swelling.  She does have numbness and tingling in the same distribution as the pain.  Prolonged activity and walking will aggravate this pain.  She does note this pain seems to be connected with the pain in her back.   Review of Systems: See HPI above.  Past Medical History:  Diagnosis Date  . H/O diverticulitis of colon   . H/O fibromyalgia   . S/P wrist surgery    left    Current Outpatient Medications on File Prior to Visit  Medication Sig Dispense Refill  . famotidine (PEPCID) 20 MG tablet Take 20 mg by mouth 2 (two) times daily.    . Naproxen Sod-diphenhydrAMINE (ALEVE PM) 220-25 MG TABS Take by mouth as needed.    . naproxen sodium (ALEVE) 220 MG tablet Take 220 mg by mouth daily as needed.    . pantoprazole (PROTONIX) 40 MG tablet TAKE 1 TABLET BY  MOUTH EVERY DAY 90 tablet 0   No current facility-administered medications on file prior to visit.    Past Surgical History:  Procedure Laterality Date  . ovarian cyst removal  86  . SURGERY OF LIP  2007   removal of lump  . TONSILLECTOMY      Allergies  Allergen Reactions  . Aspirin     Social History   Socioeconomic History  . Marital status: Married    Spouse name: Not on file  . Number of children: Not on file  . Years of education: Not on file  . Highest education level: Not on file  Occupational History  . Not on file  Tobacco Use  . Smoking status: Never Smoker  . Smokeless tobacco: Never Used  Substance and Sexual Activity  . Alcohol use: Not on file  . Drug use: Not on file  . Sexual activity: Not on file  Other Topics Concern  . Not on file  Social History Narrative   ba degree    Social etoh   Non smoker    G1P1   Social Determinants of Health   Financial Resource Strain:   . Difficulty of Paying Living Expenses: Not on file  Food Insecurity:   . Worried About Charity fundraiser in the Last  Year: Not on file  . Ran Out of Food in the Last Year: Not on file  Transportation Needs:   . Lack of Transportation (Medical): Not on file  . Lack of Transportation (Non-Medical): Not on file  Physical Activity:   . Days of Exercise per Week: Not on file  . Minutes of Exercise per Session: Not on file  Stress:   . Feeling of Stress : Not on file  Social Connections:   . Frequency of Communication with Friends and Family: Not on file  . Frequency of Social Gatherings with Friends and Family: Not on file  . Attends Religious Services: Not on file  . Active Member of Clubs or Organizations: Not on file  . Attends Banker Meetings: Not on file  . Marital Status: Not on file  Intimate Partner Violence:   . Fear of Current or Ex-Partner: Not on file  . Emotionally Abused: Not on file  . Physically Abused: Not on file  . Sexually Abused: Not  on file    Family History  Problem Relation Age of Onset  . Bipolar disorder Unknown        sibling  . GER disease Son   . Cancer - Other Unknown        sinus father died quickly   . Renal Disease Unknown        nephrctomy GM  . Diabetes Maternal Grandfather         Objective:  Physical Exam: BP 138/66   Ht 5\' 6"  (1.676 m)   Wt 190 lb (86.2 kg)   BMI 30.67 kg/m  Gen: NAD, comfortable in exam room Lungs: Breathing comfortably on room air Lumbar Exam -Inspection: No deformity, no discoloration -Palpation: Tenderness in the right SI joint region -ROM: Slightly reduced range of motion with flexion extension.  Patient with more pain with back and extension than in flexion -Strength: 5/5 hip flexion bilaterally, 5/5 knee extension bilaterally, 5/5 knee flexion bilaterally, 5/5 foot dorsiflexion bilaterally, 5/5 foot plantarflexion bilaterally -Reflexes: Patellar and Achilles reflexes diminished bilaterally however they are equal -Straight leg: Positive -SLUMP: Positive -FABER: Negative  Ankle/Foot Exam Left -Inspection: No deformity, no discoloration. Normal longitudinal and transverse arch -Palpation: Tenderness palpation over the lateral aspect of her foot over the dorsal aspect of her forefoot.  No tenderness over the plantar fascia -ROM: Normal ROM with dorsiflexion, plantarflexion, inversion, eversion -Strength: Dorsiflexion: 5/5; Plantarflexion: 5/5; Inversion: 5/5; Eversion: 5/5 -Special Tests: Anterior drawer: negative; Talar tilt: Negative; Calcaneal squeeze: Negative; Tib/fib: Negative -Limb neurovascularly intact, no instability noted  Contralateral Ankle -Inspection: No deformity, no discoloration. Normal longitudinal and transverse arch -Palpation: No tenderness palpation -ROM: Normal ROM with dorsiflexion, plantarflexion, inversion, eversion -Strength: Dorsiflexion: 5/5; Plantarflexion: 5/5; Inversion: 5/5; Eversion: 5/5 -Limb neurovascularly intact, no  instability noted  -Gait: Normal  Limited diagnostic ultrasound of bilateral feet Findings: -Plantar fascia measuring 0.6 cm thick on the left foot and 0.55 cm thick on the right foot -Patient with fluid noted within the peroneus longus and peroneus brevis tendons of the left foot Impression: -Ultrasound findings consistent with thickening of the plantar fascia bilaterally    Assessment & Plan:  Patient is a 58 y.o. female here for evaluation of low back pain as well as left foot pain  1.  Left lumbar radiculopathy -Patient's symptoms seem discogenic with radiation of symptoms down her left leg into her left foot -X-rays were ordered for further evaluation of her lumbar pain.  Patient will be called with  the results of these x-rays -Patient was given a prescription for physical therapy.  She will be referred there for 1 visit to develop a home exercise program for core and back strengthening -Patient was given green temporary insoles with scaphoid pads to help provide additional cushion and support while walking -Patient will be going to [place] after the new year for 2 months.  She can follow back up in clinic when she returns or she may do a virtual visit prior to this if she desires  2.  Left foot pain -Ultrasound findings showing thickened plantar fascia however clinically on exam patient is not having any pain over the plantar fascia.  It is possible that patient has a history of previous plantar fascia that is now resolved however it does not appear to be the current cause of her pain -Patient's foot pain is likely secondary to her lumbar spine -Patient was given green orthotics as above -Green orthotics provide patient benefit we will consider custom orthotics in the future for her  Patient will follow up after she returns from [place] or as a virtual visit sooner if desired  Patient seen and evaluated with the sports medicine fellow.  I agree with the above plan of care.   Patient's symptoms sound like they may be originating from her lumbar spine.  We will start with x-rays and green sports insoles with scaphoid pads.  Phone follow-up with x-ray findings when available.  Patient will be leaving for [place] in a couple of weeks and will be down there for several months.  She does understand that she can set up a virtual visit in the future if needed.  I also think she would benefit from custom orthotics down the road.

## 2019-07-31 NOTE — Patient Instructions (Signed)
Your back and leg pain is likely caused by a pinched nerve coming out of your back.  This can cause pain that goes down your leg into your foot as well.  Your exam today is not consistent with plantar fasciitis however it is possible that you had plantar fasciitis in the past and a recovering from this. -X-rays ordered of your lumbar spine.  We will call you with results of this after you get them done -I have written you prescription for a single visit to physical therapy.  They will teach you home exercise program that you can work on while you are in [place] the green temporary orthotics given to you today.  If you find the extra arch pads on the bottom of the orthotic are uncomfortable you may remove these on your own.  In the future if these help you we can make you custom orthotics if you desire  We will see you back after you get back from [place].  However virtual visits are also an option if you would like to do this while you are in [place]

## 2019-07-31 NOTE — Patient Instructions (Signed)
Medication Instructions:  Your physician recommends that you continue on your current medications as directed. Please refer to the Current Medication list given to you today.  *If you need a refill on your cardiac medications before your next appointment, please call your pharmacy*  Lab Work: none If you have labs (blood work) drawn today and your tests are completely normal, you will receive your results only by: Marland Kitchen MyChart Message (if you have MyChart) OR . A paper copy in the mail If you have any lab test that is abnormal or we need to change your treatment, we will call you to review the results.  Testing/Procedures: Your physician has requested that you have an echocardiogram. Echocardiography is a painless test that uses sound waves to create images of your heart. It provides your doctor with information about the size and shape of your heart and how well your heart's chambers and valves are working. This procedure takes approximately one hour. There are no restrictions for this procedure. LOCATION: [facility]: [address], [place], [place] [address]    Your physician has recommended that you have a coronary calcium scan. This will cost $150 out-of-pocket.  Coronary Calcium Scan A coronary calcium scan is an imaging test used to look for deposits of calcium and other fatty materials (plaques) in the inner lining of the blood vessels of the heart (coronary arteries). These deposits of calcium and plaques can partly clog and narrow the coronary arteries without producing any symptoms or warning signs. This puts a person at risk for a heart attack. This test can detect these deposits before symptoms develop. Tell a health care provider about:  Any allergies you have.  All medicines you are taking, including vitamins, herbs, eye drops, creams, and over-the-counter medicines.  Any problems you or family members have had with anesthetic  medicines.  Any blood disorders you have.  Any surgeries you have had.  Any medical conditions you have.  Whether you are pregnant or may be pregnant. What are the risks? Generally, this is a safe procedure. However, problems may occur, including:  Harm to a pregnant woman and her unborn baby. This test involves the use of radiation. Radiation exposure can be dangerous to a pregnant woman and her unborn baby. If you are pregnant, you generally should not have this procedure done.  Slight increase in the risk of cancer. This is because of the radiation involved in the test. What happens before the procedure? No preparation is needed for this procedure. What happens during the procedure?   You will undress and remove any jewelry around your neck or chest.  You will put on a hospital gown.  Sticky electrodes will be placed on your chest. The electrodes will be connected to an electrocardiogram (ECG) machine to record a tracing of the electrical activity of your heart.  A CT scanner will take pictures of your heart. During this time, you will be asked to lie still and hold your breath for 2-3 seconds while a picture of your heart is being taken. The procedure may vary among health care providers and hospitals. What happens after the procedure?  You can get dressed.  You can return to your normal activities.  It is up to you to get the results of your test. Ask your health care provider, or the department that is doing the test, when your results will be ready. Summary  A coronary calcium scan is an imaging  test used to look for deposits of calcium and other fatty materials (plaques) in the inner lining of the blood vessels of the heart (coronary arteries).  Generally, this is a safe procedure. Tell your health care provider if you are pregnant or may be pregnant.  No preparation is needed for this procedure.  A CT scanner will take pictures of your heart.  You can return to  your normal activities after the scan is done. This information is not intended to replace advice given to you by your health care provider. Make sure you discuss any questions you have with your health care provider.   Follow-Up: At [facility], you and your health needs are our priority.  As part of our continuing mission to provide you with exceptional heart care, we have created designated Provider Care Teams.  These Care Teams include your primary Cardiologist (physician) and Advanced Practice Providers (APPs -  Physician Assistants and Nurse Practitioners) who all work together to provide you with the care you need, when you need it.  Your next appointment:   AS NEEDED  The format for your next appointment:   Either In Person or Virtual  Provider:   You may see DR. HOCHREIN or one of the following Advanced Practice Providers on your designated Care Team:    Rosaria Ferries, PA-C  Jory Sims, DNP, ANP  Cadence Kathlen Mody, NP

## 2019-08-02 ENCOUNTER — Other Ambulatory Visit: Payer: Self-pay

## 2019-08-02 ENCOUNTER — Telehealth: Payer: Self-pay | Admitting: Sports Medicine

## 2019-08-02 MED ORDER — NAPROXEN 500 MG PO TABS: 500.0000 mg | ORAL_TABLET | Freq: Two times a day (BID) | ORAL | 0 refills | Status: DC | PRN

## 2019-08-02 NOTE — Telephone Encounter (Signed)
  I spoke with the patient on the phone today after reviewing her lumbar spine x-ray.  She has mild multilevel degenerative changes but nothing severe.  Incidental finding of a left renal and ureteral stone.  She finds the green sports insoles and scaphoid pads to be comfortable.  She is continuing to get nighttime pain.  She currently takes Aleve but would like to try a stronger dose.  We will try Naprosyn 500 mg twice daily with food as needed.  She has arranged for a physical therapy visit next week and she will be leaving in a couple of weeks for [place].  She will be there for 2 to 3 months.  She may request a virtual visit from there if needed.

## 2019-08-13 ENCOUNTER — Ambulatory Visit (HOSPITAL_COMMUNITY): Payer: Managed Care, Other (non HMO) | Attending: Cardiology

## 2019-08-13 ENCOUNTER — Ambulatory Visit (INDEPENDENT_AMBULATORY_CARE_PROVIDER_SITE_OTHER)
Admission: RE | Admit: 2019-08-13 | Discharge: 2019-08-13 | Disposition: A | Discharge status: Home / Self Care | Payer: Self-pay | Source: Ambulatory Visit | Attending: Cardiology | Admitting: Cardiology

## 2019-08-13 ENCOUNTER — Other Ambulatory Visit: Payer: Self-pay

## 2019-08-13 DIAGNOSIS — R0609 Other forms of dyspnea: Secondary | ICD-10-CM

## 2019-08-13 DIAGNOSIS — R002 Palpitations: Secondary | ICD-10-CM | POA: Diagnosis not present

## 2019-08-13 DIAGNOSIS — R0600 Dyspnea, unspecified: Secondary | ICD-10-CM

## 2019-08-21 ENCOUNTER — Telehealth: Payer: Self-pay

## 2019-08-21 DIAGNOSIS — Z13220 Encounter for screening for lipoid disorders: Secondary | ICD-10-CM

## 2019-08-21 DIAGNOSIS — R931 Abnormal findings on diagnostic imaging of heart and coronary circulation: Secondary | ICD-10-CM

## 2019-08-21 NOTE — Telephone Encounter (Signed)
New Message  Pt is calling back to receive her results.  Please call.

## 2019-08-21 NOTE — Telephone Encounter (Signed)
Spoke with pt regarding the following:  ECHOCARDIOGRAM COMPLETE: Result Notes         Rollene Rotunda, MD  08/15/2019 11:55 AM EST    Essentially normal.       CT CARDIAC SCORING: Result Notes         Rollene Rotunda, MD  08/15/2019 11:46 AM EST    She does have some mild coronary calcium and some aortic plaque. I would like for her to have a POET (Plain Old Exercise Treadmill). I would suggest a statin, Lipitor 20 mg po daily. Disp number 30 with 11 refills. Draw a lipid profile and liver enzymes in 10 weeks.     Pt verbalized understanding. Pt will be in [place] until March 2021.She would like to proceed with POET but schedule in March 2021. She is aware she will need COVID test 3-4 days prior to POET. She states Dr. Antoine Poche has not seen updated lipid labs on her (most recent were 2019) and she would like to repeat these before starting statin. She would also like to work on dietary changes/ heart-healthy diet for now and then have f/u appt with Dr. Antoine Poche after POET. Can discuss statin and do lipid panel again and liver panel during f/u. Informed pt that encounter to be routed to Dr. Antoine Poche and nurse will place order for POET. Pt agreeable

## 2019-08-22 NOTE — Telephone Encounter (Signed)
OK to repeat a lipid and liver profile.

## 2019-08-23 NOTE — Telephone Encounter (Signed)
Spoke with patient and sent message to scheduling to arrange ETT Patient aware will need COVID screening prior Will get labs prior to COVID

## 2019-08-25 ENCOUNTER — Other Ambulatory Visit: Payer: Self-pay | Admitting: Sports Medicine

## 2019-08-27 ENCOUNTER — Telehealth: Payer: Self-pay | Admitting: Cardiology

## 2019-08-27 NOTE — Telephone Encounter (Signed)
Left message for patient to call and schedule ETT ordered by Dr. Antoine Poche.  Patient will also need a COVID 19 test scheduled prior to the ETT.

## 2019-08-28 ENCOUNTER — Other Ambulatory Visit: Payer: Self-pay

## 2019-08-28 ENCOUNTER — Encounter: Payer: Self-pay | Admitting: Cardiology

## 2019-10-17 ENCOUNTER — Other Ambulatory Visit: Payer: Self-pay | Admitting: Internal Medicine

## 2019-10-26 LAB — HEPATIC FUNCTION PANEL
ALT: 16 IU/L (ref 0–32)
AST: 18 IU/L (ref 0–40)
Albumin: 4.6 g/dL (ref 3.8–4.9)
Alkaline Phosphatase: 68 IU/L (ref 39–117)
Bilirubin Total: 0.5 mg/dL (ref 0.0–1.2)
Bilirubin, Direct: 0.13 mg/dL (ref 0.00–0.40)
Total Protein: 6.4 g/dL (ref 6.0–8.5)

## 2019-10-26 LAB — LIPID PANEL
Chol/HDL Ratio: 2.8 ratio (ref 0.0–4.4)
Cholesterol, Total: 198 mg/dL (ref 100–199)
HDL: 72 mg/dL (ref >39)
LDL Chol Calc (NIH): 113 mg/dL — ABNORMAL HIGH (ref 0–99)
Triglycerides: 69 mg/dL (ref 0–149)
VLDL Cholesterol Cal: 13 mg/dL (ref 5–40)

## 2019-10-29 ENCOUNTER — Other Ambulatory Visit (HOSPITAL_COMMUNITY)
Admission: RE | Admit: 2019-10-29 | Discharge: 2019-10-29 | Disposition: A | Discharge status: Home / Self Care | Payer: Managed Care, Other (non HMO) | Source: Ambulatory Visit | Attending: Cardiology | Admitting: Cardiology

## 2019-10-29 DIAGNOSIS — Z01812 Encounter for preprocedural laboratory examination: Secondary | ICD-10-CM | POA: Diagnosis present

## 2019-10-29 DIAGNOSIS — Z20822 Contact with and (suspected) exposure to covid-19: Secondary | ICD-10-CM | POA: Diagnosis not present

## 2019-10-29 LAB — SARS CORONAVIRUS 2 (TAT 6-24 HRS): SARS Coronavirus 2: NEGATIVE

## 2019-10-30 ENCOUNTER — Telehealth (HOSPITAL_COMMUNITY): Payer: Self-pay

## 2019-10-30 ENCOUNTER — Other Ambulatory Visit: Payer: Self-pay

## 2019-10-30 DIAGNOSIS — R931 Abnormal findings on diagnostic imaging of heart and coronary circulation: Secondary | ICD-10-CM

## 2019-10-30 MED ORDER — ATORVASTATIN CALCIUM 20 MG PO TABS: 20.0000 mg | ORAL_TABLET | Freq: Every day | ORAL | 3 refills | Status: DC

## 2019-10-30 NOTE — Progress Notes (Signed)
Per Dr. Antoine Poche - Given her coronary calcium I would suggest starting Lipitor 20 mg po daily. Repeat lipid and liver in 10 weeks.

## 2019-10-30 NOTE — Telephone Encounter (Signed)
Encounter complete. 

## 2019-11-01 ENCOUNTER — Ambulatory Visit (HOSPITAL_COMMUNITY)
Admission: RE | Admit: 2019-11-01 | Discharge: 2019-11-01 | Disposition: A | Discharge status: Home / Self Care | Payer: Managed Care, Other (non HMO) | Source: Ambulatory Visit | Attending: Internal Medicine | Admitting: Internal Medicine

## 2019-11-01 ENCOUNTER — Other Ambulatory Visit: Payer: Self-pay

## 2019-11-01 DIAGNOSIS — R931 Abnormal findings on diagnostic imaging of heart and coronary circulation: Secondary | ICD-10-CM | POA: Diagnosis not present

## 2019-11-01 LAB — EXERCISE TOLERANCE TEST
Estimated workload: 10.1 METS
Exercise duration (min): 9 min
Exercise duration (sec): 1 s
MPHR: 160 {beats}/min
Peak HR: 144 {beats}/min
Percent HR: 90 %
Rest HR: 77 {beats}/min

## 2019-11-06 ENCOUNTER — Telehealth: Payer: Self-pay | Admitting: Cardiology

## 2019-11-06 NOTE — Telephone Encounter (Signed)
Follow Up:   Pt said she had her Stress test on 11-01-19. She said she saw her results on My- Chart, but she did not understand it.

## 2019-11-06 NOTE — Telephone Encounter (Signed)
Rollene Rotunda, MD  11/01/2019 5:29 PM EDT    Negative for any evidence of ischemia. No further work up. I can see her back in about six months to follow up primary risk reduction.     Per DPR form, left detailed message with results on VM. Instructed patient to call with questions or concerns.  6 mo recall placed.

## 2019-11-08 NOTE — Telephone Encounter (Signed)
Patient states she is calling to follow up in regards to stress test results from stress test completed on 11/01/19. She states she is unable to interpret her results on MyChart. Please return call to discuss.

## 2019-11-08 NOTE — Telephone Encounter (Signed)
Spoke with patient. Results reviewed.

## 2019-11-14 ENCOUNTER — Other Ambulatory Visit: Payer: Self-pay

## 2019-11-14 DIAGNOSIS — Z13220 Encounter for screening for lipoid disorders: Secondary | ICD-10-CM

## 2019-11-14 NOTE — Progress Notes (Signed)
Per Dr. Antoine Poche: I think the statin is optional. I would suggest leaning toward a plant based diet and consistent physical exercise in place of a statin.  She can have follow up of her Lipid after 3 - 4 months of significant adherence to these suggestions.   Lipid order placed for around 02/13/20.

## 2019-12-18 ENCOUNTER — Telehealth: Payer: Self-pay | Admitting: Internal Medicine

## 2019-12-18 NOTE — Telephone Encounter (Signed)
Pt received her 2nd dosage of the COVID vaccine. Per pt, she has red dots on abdomen since Sunday. Pt is not sure if this is a side effect from the vaccine. Thanks

## 2019-12-18 NOTE — Telephone Encounter (Signed)
Cant say   But can take a picture and send it In.   Side effects from vaccine are short lived and usually resolve with in days

## 2019-12-18 NOTE — Telephone Encounter (Signed)
Patient called back and asked how to send a MyChart message and how to attach the picture. I have given advise and hopefully she will be able to send soon.

## 2019-12-18 NOTE — Telephone Encounter (Signed)
Called patient and LMOVM to return call  Left a detailed voice message to please send a picture of the red dots and to let us know if she is having itching or pain in a MyChart message.

## 2019-12-19 NOTE — Telephone Encounter (Signed)
So not sure if related to the vaccine  You can report this   As happenin after the vaccine     I think  Observation   to do at this time if not in other way sick  Let us know how  If progressing or if persistent next week we can then have a visit

## 2020-03-21 ENCOUNTER — Telehealth: Payer: Self-pay | Admitting: Cardiology

## 2020-03-21 NOTE — Telephone Encounter (Signed)
LVM for patient to return call to get follow up scheduled with Hochrein from recall list 

## 2020-06-02 ENCOUNTER — Other Ambulatory Visit: Payer: Self-pay | Admitting: Internal Medicine

## 2020-08-19 ENCOUNTER — Ambulatory Visit: Payer: Managed Care, Other (non HMO) | Admitting: Orthopaedic Surgery

## 2020-08-20 ENCOUNTER — Ambulatory Visit: Payer: Managed Care, Other (non HMO) | Admitting: Orthopaedic Surgery

## 2020-08-25 ENCOUNTER — Other Ambulatory Visit: Payer: Self-pay | Admitting: Internal Medicine

## 2020-09-02 ENCOUNTER — Ambulatory Visit: Payer: Managed Care, Other (non HMO) | Admitting: Orthopaedic Surgery

## 2020-09-03 ENCOUNTER — Other Ambulatory Visit: Payer: Self-pay

## 2020-09-03 ENCOUNTER — Ambulatory Visit (INDEPENDENT_AMBULATORY_CARE_PROVIDER_SITE_OTHER): Payer: Managed Care, Other (non HMO) | Admitting: Orthopaedic Surgery

## 2020-09-03 ENCOUNTER — Ambulatory Visit: Payer: Self-pay

## 2020-09-03 ENCOUNTER — Ambulatory Visit (INDEPENDENT_AMBULATORY_CARE_PROVIDER_SITE_OTHER): Payer: Managed Care, Other (non HMO)

## 2020-09-03 VITALS — Ht 66.0 in | Wt 196.0 lb

## 2020-09-03 DIAGNOSIS — M25552 Pain in left hip: Secondary | ICD-10-CM

## 2020-09-03 DIAGNOSIS — M25562 Pain in left knee: Secondary | ICD-10-CM | POA: Diagnosis not present

## 2020-09-03 DIAGNOSIS — M25559 Pain in unspecified hip: Secondary | ICD-10-CM | POA: Diagnosis not present

## 2020-09-03 NOTE — Progress Notes (Signed)
Office Visit Note   Patient: Melinda Harrington           Date of Birth: 12/03/1960           MRN: [id number] Visit Date: 09/03/2020              Requested by: Madelin Headings, MD [address] [place],  [place] [address] PCP: Madelin Headings, MD   Assessment & Plan: Visit Diagnoses:  1. Hip pain   2. Pain in left hip   3. Left knee pain, unspecified chronicity     Plan: Given her continued left groin pain with popping in her hip, a MRI is warranted of left hip to rule out a labral tear.  This will be a MRI arthrogram.  They can also assess the ligamentous and tendinous structures around the left hip trochanteric area.  She agrees with this treatment plan.  I will see her back after the MRI.  She then leaves for [place] for the rest of the winter and early February.  Follow-Up Instructions: Return in about 2 weeks (around 09/17/2020).   Orders:  Orders Placed This Encounter  Procedures  . XR HIP UNILAT W OR W/O PELVIS 1V LEFT  . XR Knee 1-2 Views Left   No orders of the defined types were placed in this encounter.     Procedures: No procedures performed   Clinical Data: No additional findings.   Subjective: Chief Complaint  Patient presents with  . Left Hip - Pain  The patient is a very active and pleasant 60 year old female who comes in for evaluation treatment of left hip and groin pain as well as left knee pain.  She is also been dealing with low back pain to the left side and sciatica.  She has been a patient before of [facility] and has had several epidural steroid injections.  She is very active and has done Pilates quite a bit.  She is work with a physical therapist who is a PhD in physical therapy and work with a massage therapist.  She does get a lot of catching in her left hip and groin area with popping.  She has medial sided left knee pain.  She has pain over the left hip trochanteric area and sciatic area on the left side.  She is not a  diabetic.  She has no significant active medical issues otherwise.  This pain is definitely affecting her mobility, her quality of life and actives daily living.  She does live a lot of times in constant pain due to this.  HPI  Review of Systems There is currently no listed headache, chest pain, shortness of breath, fever, chills, nausea, vomiting  Objective: Vital Signs: Ht 5\' 6"  (1.676 m)   Wt 196 lb (88.9 kg)   BMI 31.64 kg/m   Physical Exam She is alert and orient x3 and in no acute distress.  She does not walk with a significant limp Ortho Exam Examination of her left hip shows it moves smoothly and fluidly with some pain in the groin but the rotation is full.  She does hurt on the extremes of rotation.  She has a lot of pain to palpation over the trochanteric area on the left side and the IT band.  There is no knee effusion with her left knee.  There are some medial joint line tenderness but her patella tracks well and her knee is ligamentously stable with full range of motion.  She  has negative straight leg raise and her leg lengths are equal. Specialty Comments:  No specialty comments available.  Imaging: XR HIP UNILAT W OR W/O PELVIS 1V LEFT  Result Date: 09/03/2020 An AP pelvis and lateral left hip shows a normal-appearing left hip and right hip.  The joint space is well-maintained.  There is no cortical irregularities around the joint over the trochanteric area.  The joint space is congruent.    PMFS History: Patient Active Problem List   Diagnosis Date Noted  . Palpitations 07/30/2019  . Educated about COVID-19 virus infection 07/30/2019  . SWELLING OF LIMB 09/09/2010  . NONSPECIFIC ABNORMAL FINDING IN STOOL CONTENTS 09/16/2009  . INTERMITTENT VERTIGO 10/18/2008  . WEIGHT GAIN 01/12/2008  . HYPERGLYCEMIA, MILD 01/12/2008  . RHINITIS 09/29/2007  . DYSPHAGIA 09/29/2007  . GERD 08/22/2007  . FIBROMYALGIA 07/11/2007  . HEARTBURN 07/11/2007  . Personal history of  other diseases of digestive system 07/11/2007   Past Medical History:  Diagnosis Date  . H/O diverticulitis of colon   . H/O fibromyalgia     Family History  Problem Relation Age of Onset  . Bipolar disorder Other        sibling  . GER disease Son   . Cancer - Other Other        sinus father died quickly   . Renal Disease Other        nephrctomy GM  . Diabetes Maternal Grandfather   . Atrial fibrillation Mother   . Dementia Mother   . Cancer Father        ENT     Past Surgical History:  Procedure Laterality Date  . ovarian cyst removal  86  . SURGERY OF LIP  2007   removal of lump  . TONSILLECTOMY     Social History   Occupational History  . Not on file  Tobacco Use  . Smoking status: Never Smoker  . Smokeless tobacco: Never Used  Substance and Sexual Activity  . Alcohol use: Not on file  . Drug use: Not on file  . Sexual activity: Not on file

## 2020-09-16 ENCOUNTER — Ambulatory Visit (INDEPENDENT_AMBULATORY_CARE_PROVIDER_SITE_OTHER): Payer: Managed Care, Other (non HMO) | Admitting: Orthopaedic Surgery

## 2020-09-16 DIAGNOSIS — M25552 Pain in left hip: Secondary | ICD-10-CM

## 2020-09-16 MED ORDER — LIDOCAINE HCL 1 % IJ SOLN
3.0000 mL | INTRAMUSCULAR | Status: AC | PRN
  Administered 2020-09-16: 3 mL

## 2020-09-16 MED ORDER — METHYLPREDNISOLONE ACETATE 40 MG/ML IJ SUSP
40.0000 mg | INTRAMUSCULAR | Status: AC | PRN
  Administered 2020-09-16: 40 mg via INTRA_ARTICULAR

## 2020-09-16 MED ORDER — METHOCARBAMOL 500 MG PO TABS: 500.0000 mg | ORAL_TABLET | Freq: Four times a day (QID) | ORAL | 1 refills | Status: DC | PRN

## 2020-09-16 MED ORDER — METHYLPREDNISOLONE 4 MG PO TABS
ORAL_TABLET | ORAL | 0 refills | Status: DC
Start: 2020-09-16 — End: 2021-12-03

## 2020-09-16 NOTE — Progress Notes (Signed)
Office Visit Note   Patient: Melinda Harrington           Date of Birth: 1961-04-25           MRN: [id number] Visit Date: 09/16/2020              Requested by: Madelin Headings, MD [address] [place],  [place] [address] PCP: Madelin Headings, MD   Assessment & Plan: Visit Diagnoses:  1. Pain in left hip     Plan: I did provide a trochanteric steroid injection for her left hip.  We will see her back a few days after her MRI arthrogram of the left hip.  I will also try a steroid taper for low back pain as well as some Robaxin.  All questions and concerns were answered and addressed.  Follow-Up Instructions: Return in about 4 weeks (around 10/14/2020).   Orders:  Orders Placed This Encounter  Procedures  . Large Joint Inj   No orders of the defined types were placed in this encounter.     Procedures: Large Joint Inj: L greater trochanter on 09/16/2020 9:55 AM Indications: pain and diagnostic evaluation Details: 22 G 1.5 in needle, lateral approach  Arthrogram: No  Medications: 3 mL lidocaine 1 %; 40 mg methylPREDNISolone acetate 40 MG/ML Outcome: tolerated well, no immediate complications Procedure, treatment alternatives, risks and benefits explained, specific risks discussed. Consent was given by the patient. Immediately prior to procedure a time out was called to verify the correct patient, procedure, equipment, support staff and site/side marked as required. Patient was prepped and draped in the usual sterile fashion.       Clinical Data: No additional findings.   Subjective: Chief Complaint  Patient presents with  . Left Hip - Pain  The patient is well-known to me.  She is scheduled for an MRI arthrogram of the left hip later this month.  She would like to have a trochanteric injection today.  She was placed to head to [place] but her husband had a traumatic fall with a significant brain injury and is recovering in the ICU at [facility] [place].  She is distraught  as it relates to her husband's health and what is going on.  She has been having a flareup of low back pain to the left side as well.  HPI  Review of Systems There is currently no headache, chest pain, shortness of breath, fever, chills, nausea, vomiting  Objective: Vital Signs: There were no vitals taken for this visit.  Physical Exam She is alert and orient x3 and in no acute is distress Ortho Exam Examination of her left hip shows fluid and full motion of the hip but pain a lot over the trochanteric area.  There is also pain to the lower back to the left side and the facet joint areas and does wrap into the IT band area down her left leg. Specialty Comments:  No specialty comments available.  Imaging: No results found.   PMFS History: Patient Active Problem List   Diagnosis Date Noted  . Palpitations 07/30/2019  . Educated about COVID-19 virus infection 07/30/2019  . SWELLING OF LIMB 09/09/2010  . NONSPECIFIC ABNORMAL FINDING IN STOOL CONTENTS 09/16/2009  . INTERMITTENT VERTIGO 10/18/2008  . WEIGHT GAIN 01/12/2008  . HYPERGLYCEMIA, MILD 01/12/2008  . RHINITIS 09/29/2007  . DYSPHAGIA 09/29/2007  . GERD 08/22/2007  . FIBROMYALGIA 07/11/2007  . HEARTBURN 07/11/2007  . Personal history of other diseases of digestive system 07/11/2007  Past Medical History:  Diagnosis Date  . H/O diverticulitis of colon   . H/O fibromyalgia     Family History  Problem Relation Age of Onset  . Bipolar disorder Other        sibling  . GER disease Son   . Cancer - Other Other        sinus father died quickly   . Renal Disease Other        nephrctomy GM  . Diabetes Maternal Grandfather   . Atrial fibrillation Mother   . Dementia Mother   . Cancer Father        ENT     Past Surgical History:  Procedure Laterality Date  . ovarian cyst removal  86  . SURGERY OF LIP  2007   removal of lump  . TONSILLECTOMY     Social History   Occupational History  . Not on file  Tobacco  Use  . Smoking status: Never Smoker  . Smokeless tobacco: Never Used  Substance and Sexual Activity  . Alcohol use: Not on file  . Drug use: Not on file  . Sexual activity: Not on file

## 2020-09-18 ENCOUNTER — Ambulatory Visit: Payer: Managed Care, Other (non HMO) | Admitting: Orthopaedic Surgery

## 2020-09-19 ENCOUNTER — Ambulatory Visit
Admission: RE | Admit: 2020-09-19 | Discharge: 2020-09-19 | Disposition: A | Discharge status: Home / Self Care | Payer: Managed Care, Other (non HMO) | Source: Ambulatory Visit | Attending: Orthopaedic Surgery | Admitting: Orthopaedic Surgery

## 2020-09-19 ENCOUNTER — Other Ambulatory Visit: Payer: Self-pay

## 2020-09-19 DIAGNOSIS — M25552 Pain in left hip: Secondary | ICD-10-CM

## 2020-09-19 MED ORDER — IOPAMIDOL (ISOVUE-M 200) INJECTION 41%
12.0000 mL | Freq: Once | INTRAMUSCULAR | Status: AC
  Administered 2020-09-19: 12 mL via INTRA_ARTICULAR

## 2020-09-23 ENCOUNTER — Ambulatory Visit (INDEPENDENT_AMBULATORY_CARE_PROVIDER_SITE_OTHER): Payer: Managed Care, Other (non HMO) | Admitting: Orthopaedic Surgery

## 2020-09-23 ENCOUNTER — Other Ambulatory Visit: Payer: Self-pay

## 2020-09-23 ENCOUNTER — Encounter: Payer: Self-pay | Admitting: Orthopaedic Surgery

## 2020-09-23 DIAGNOSIS — G8929 Other chronic pain: Secondary | ICD-10-CM | POA: Diagnosis not present

## 2020-09-23 DIAGNOSIS — M5442 Lumbago with sciatica, left side: Secondary | ICD-10-CM

## 2020-09-23 DIAGNOSIS — M25552 Pain in left hip: Secondary | ICD-10-CM

## 2020-09-23 NOTE — Progress Notes (Signed)
The patient comes in today to go over a MRI of her left hip.  This was an MRI arthrogram.  However pain seems to be down the trochanteric area of the hip and some in the groin but also the left side of her low back.  She is an active 60 year old female.  I can still put her head left hip through internal extra rotation with no significant discomfort.  Her pain seems to be of the trochanteric area and IT band.  She did show me a little rash around her torso just to the left side.  It does not appear to be shingles but it something that she should try hydrocortisone cream on this area twice daily.  The MRI of her hip showed just some mild degenerative changes of the labrum but no frank tear.  The cartilage on the femoral head and acetabulum was preserved.  There is no edema in the bone.  There is some mild tendinitis of the gluteus medius and minimus tendons but no tear.  There is no other pathology around her left hip they can be seen.  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  I would like to see her back in 6 weeks to see how she is doing overall.  All questions and concerns were answered and addressed.

## 2020-09-29 ENCOUNTER — Ambulatory Visit (INDEPENDENT_AMBULATORY_CARE_PROVIDER_SITE_OTHER): Payer: 59 | Admitting: Psychology

## 2020-09-30 DIAGNOSIS — F4322 Adjustment disorder with anxiety: Secondary | ICD-10-CM | POA: Diagnosis not present

## 2020-10-01 ENCOUNTER — Encounter: Payer: Self-pay | Admitting: Cardiology

## 2020-10-08 ENCOUNTER — Other Ambulatory Visit: Payer: Managed Care, Other (non HMO)

## 2020-10-09 ENCOUNTER — Ambulatory Visit: Payer: Managed Care, Other (non HMO) | Admitting: Orthopaedic Surgery

## 2020-10-13 ENCOUNTER — Ambulatory Visit: Payer: Managed Care, Other (non HMO) | Admitting: Orthopaedic Surgery

## 2020-11-20 ENCOUNTER — Other Ambulatory Visit: Payer: Self-pay | Admitting: Internal Medicine

## 2021-07-07 ENCOUNTER — Other Ambulatory Visit (HOSPITAL_COMMUNITY): Payer: Self-pay

## 2021-07-07 MED ORDER — OZEMPIC (1 MG/DOSE) 4 MG/3ML ~~LOC~~ SOPN
1.0000 mg | PEN_INJECTOR | SUBCUTANEOUS | 1 refills | Status: DC
  Filled 2021-07-07: qty 3, 28d supply, fill #0
  Filled 2021-07-27: qty 3, 28d supply, fill #1

## 2021-07-27 ENCOUNTER — Other Ambulatory Visit (HOSPITAL_COMMUNITY): Payer: Self-pay

## 2021-07-28 ENCOUNTER — Other Ambulatory Visit (HOSPITAL_COMMUNITY): Payer: Self-pay

## 2021-08-05 ENCOUNTER — Other Ambulatory Visit (HOSPITAL_COMMUNITY): Payer: Self-pay

## 2021-08-05 MED ORDER — OZEMPIC (1 MG/DOSE) 4 MG/3ML ~~LOC~~ SOPN
1.0000 mg | PEN_INJECTOR | SUBCUTANEOUS | 1 refills | Status: DC
  Filled 2021-08-05 – 2021-08-12 (×2): qty 3, 28d supply, fill #0

## 2021-08-12 ENCOUNTER — Other Ambulatory Visit (HOSPITAL_COMMUNITY): Payer: Self-pay

## 2021-10-21 ENCOUNTER — Other Ambulatory Visit (HOSPITAL_COMMUNITY): Payer: Self-pay

## 2021-10-21 MED ORDER — OZEMPIC (2 MG/DOSE) 8 MG/3ML ~~LOC~~ SOPN
2.0000 mg | PEN_INJECTOR | SUBCUTANEOUS | 6 refills | Status: DC
  Filled 2021-10-21 – 2021-10-22 (×2): qty 3, 28d supply, fill #0

## 2021-10-22 ENCOUNTER — Other Ambulatory Visit (HOSPITAL_COMMUNITY): Payer: Self-pay

## 2021-10-30 ENCOUNTER — Other Ambulatory Visit (HOSPITAL_COMMUNITY): Payer: Self-pay

## 2021-12-03 ENCOUNTER — Encounter: Payer: Self-pay | Admitting: Cardiology

## 2021-12-03 ENCOUNTER — Ambulatory Visit: Payer: Managed Care, Other (non HMO) | Admitting: Cardiology

## 2021-12-03 VITALS — BP 141/84 | HR 84 | Temp 98.3°F | Resp 17 | Ht 66.0 in | Wt 185.2 lb

## 2021-12-03 DIAGNOSIS — E7800 Pure hypercholesterolemia, unspecified: Secondary | ICD-10-CM

## 2021-12-03 DIAGNOSIS — R931 Abnormal findings on diagnostic imaging of heart and coronary circulation: Secondary | ICD-10-CM

## 2021-12-03 DIAGNOSIS — R030 Elevated blood-pressure reading, without diagnosis of hypertension: Secondary | ICD-10-CM

## 2021-12-03 DIAGNOSIS — I2510 Atherosclerotic heart disease of native coronary artery without angina pectoris: Secondary | ICD-10-CM

## 2021-12-03 MED ORDER — SIMVASTATIN 20 MG PO TABS: 20.0000 mg | ORAL_TABLET | Freq: Every day | ORAL | 2 refills | Status: DC

## 2021-12-03 NOTE — Progress Notes (Signed)
? ?Primary Physician/Referring:  Adrian Prince, MD ? ?Patient ID: Melinda Harrington, female    DOB: 1961-05-03, 61 y.o.   MRN: [id number] ? ?Chief Complaint  ?Patient presents with  ? New Patient (Initial Visit)  ? Coronary Artery Disease  ? ?HPI:   ? ?Melinda Harrington  is a 61 y.o. Caucasian female patient with hyperlipidemia, no history of hypertension or diabetes and a non-smoker lifelong, referred to me for evaluation of elevated coronary calcium score in the 80th percentile suggestive of underlying asymptomatic coronary atherosclerosis and management and second opinion regarding primary prevention.  She is asymptomatic. ? ?Past Medical History:  ?Diagnosis Date  ? H/O diverticulitis of colon   ? H/O fibromyalgia   ? ?Past Surgical History:  ?Procedure Laterality Date  ? ovarian cyst removal  86  ? SURGERY OF LIP  2007  ? removal of lump  ? TONSILLECTOMY    ? ?Family History  ?Problem Relation Age of Onset  ? Atrial fibrillation Mother   ? Dementia Mother   ? Cancer Father   ?     ENT   ? Diabetes Maternal Grandfather   ? GER disease Son   ? Bipolar disorder Other   ?     sibling  ? Cancer - Other Other   ?     sinus father died quickly   ? Renal Disease Other   ?     nephrctomy GM  ?  ?Social History  ? ?Tobacco Use  ? Smoking status: Never  ? Smokeless tobacco: Never  ?Substance Use Topics  ? Alcohol use: Yes  ?  Comment: occ  ? ?Marital Status: Married  ?ROS  ?Review of Systems  ?Cardiovascular:  Negative for chest pain, dyspnea on exertion and leg swelling.  ?Objective  ?Blood pressure (!) 141/84, pulse 84, temperature 98.3 ?F (36.8 ?C), temperature source Temporal, resp. rate 17, height 5\' 6"  (1.676 m), weight 185 lb 3.2 oz (84 kg), SpO2 97 %. Body mass index is 29.89 kg/m?.  ? ?  12/03/2021  ? 12:33 PM 12/03/2021  ? 12:24 PM 09/03/2020  ?  9:49 AM  ?Vitals with BMI  ?Height  5\' 6"  5\' 6"   ?Weight  185 lbs 3 oz 196 lbs  ?BMI  29.91 31.65  ?Systolic 141 148   ?Diastolic 84 92   ?Pulse 84 85   ?   ?Physical Exam  ?Neck: No JVD present.  ?Cardiovascular: Regular rhythm, normal heart sounds, intact distal pulses and normal pulses. Exam reveals no gallop.  ?No murmur heard. ?Pulmonary/Chest: Effort normal and breath sounds normal.  ?Abdominal: Soft. Bowel sounds are normal.  ?Musculoskeletal:     ?   General: No edema.  ? ?Medications and allergies  ? ?Allergies  ?Allergen Reactions  ? Aspirin   ?  ?Medication list after today's encounter  ? ?Current Outpatient Medications:  ?  naproxen sodium (ALEVE) 220 MG tablet, Take 220 mg by mouth daily as needed., Disp: , Rfl:  ?  pantoprazole (PROTONIX) 40 MG tablet, TAKE 1 TABLET BY MOUTH EVERY DAY, Disp: 30 tablet, Rfl: 0 ?  simvastatin (ZOCOR) 20 MG tablet, Take 1 tablet (20 mg total) by mouth at bedtime., Disp: 30 tablet, Rfl: 2 ? ?Laboratory examination:  ? ?External labs:  ? ?Labs 11/10/2021: ? ?Hb 15.1/HCT 44.1, platelets 278. ? ?Total cholesterol 216, triglycerides 84, HDL 58, LDL 131. ? ?TSH normal at .  CMP normal, serum glucose 94 mg. ? ?Radiology:  ? ?Cardiac Studies:  ? ?  Coronary calcium score 08/13/2019: ?1. No active cardiopulmonary abnormalities. ?2. Calcified granulomas identified within the right lower lobe and lingula. ?3.  Aortic Atherosclerosis (ICD10-I70.0). ?4.  Coronary calcium score of 32, 80th percentile in [place] [employer] [place]. ? ?Echocardiogram 08/13/2019: ?Normal LV systolic function, EF 65 to 70%.  Mild LVH, grade 1 diastolic dysfunction. ?Normal RV size and function. ?Mild MR. ?No other significant valvular abnormalities.  Normal IVC.Marland Kitchen ? ?Regular exercise stress test using Bruce protocol 11/01/2019: ?Good exercise capacity, 9 minutes 10.1 METS ?Normal BP and HR response to exercise. ?Test stopped for shortness of breath. ?Borderline nonspecific changes on resting ECG. Upsloping ST segment depression was noted during stress in the II, III, aVF, V4, V6 and V5 leads, beginning at 4 minutes of stress, and returning to baseline after 1-5 minutes of  recovery. ?Rare PVCs throughout study. ?EKG:  ? ?EKG 12/03/2021: Normal sinus rhythm with rate of 70 bpm, left atrial enlargement, left axis deviation, left anterior fascicular block.  Incomplete right bundle branch block.  No evidence of ischemia.   ? ?Assessment  ? ?  ICD-10-CM   ?1. Coronary artery disease involving native coronary artery of native heart without angina pectoris  I25.10 simvastatin (ZOCOR) 20 MG tablet  ?  CT CARDIAC SCORING ([facility] LOCATIONS ONLY)  ?  LDL cholesterol, direct  ?  Lipid Panel With LDL/HDL Ratio  ?  Lipoprotein A (LPA)  ?  Apo A1 + B + Ratio  ?  ?2. Elevated coronary artery calcium score 08/13/2019: Coronary calcium score of 32, 80th percentile in [place].  R93.1 EKG 12-Lead  ?  ?3. Hypercholesteremia  E78.00 simvastatin (ZOCOR) 20 MG tablet  ?  LDL cholesterol, direct  ?  Lipid Panel With LDL/HDL Ratio  ?  Lipoprotein A (LPA)  ?  Apo A1 + B + Ratio  ?  ?4. Elevated BP without diagnosis of hypertension  R03.0   ?  ?  ?Medications Discontinued During This Encounter  ?Medication Reason  ? Semaglutide, 2 MG/DOSE, (OZEMPIC, 2 MG/DOSE,) 8 MG/3ML SOPN   ? Semaglutide, 1 MG/DOSE, (OZEMPIC, 1 MG/DOSE,) 4 MG/3ML SOPN   ? methylPREDNISolone (MEDROL) 4 MG tablet   ? methocarbamol (ROBAXIN) 500 MG tablet   ? atorvastatin (LIPITOR) 20 MG tablet   ? naproxen (NAPROSYN) 500 MG tablet   ?  ?Meds ordered this encounter  ?Medications  ? simvastatin (ZOCOR) 20 MG tablet  ?  Sig: Take 1 tablet (20 mg total) by mouth at bedtime.  ?  Dispense:  30 tablet  ?  Refill:  2  ? ?Orders Placed This Encounter  ?Procedures  ? CT CARDIAC SCORING ([facility] LOCATIONS ONLY)  ?  $99 tos  ?Epic order  ?  Standing Status:   Future  ?  Standing Expiration Date:   02/02/2022  ?  Order Specific Question:   Preferred imaging location?  ?  Answer:   [facility]  ? LDL cholesterol, direct  ? Lipid Panel With LDL/HDL Ratio  ? Lipoprotein A (LPA)  ? Apo A1 + B + Ratio  ? EKG 12-Lead  ? ?Recommendations:  ? ?Melinda Harrington is a 61  y.o. Caucasian female patient with hyperlipidemia, no history of hypertension or diabetes and a non-smoker lifelong, referred to me for evaluation of elevated coronary calcium score in the 80th percentile suggestive of underlying asymptomatic coronary atherosclerosis and management and second opinion regarding primary prevention. ? ?Patient has been reluctant in taking statins due to "side effects".  She also has mild obesity and  has been battling weight loss for many years with difficulty.  She was started on semaglutide however discontinued this due to cost. ? ?She remains active, essentially asymptomatic with normal physical examination otherwise normal EKG.  I discussed with her regarding options of therapy in patients with mild stable coronary disease and elevated coronary calcium score, best option would be statin therapy.  After long discussion, she is willing to start simvastatin 20 mg daily and could potentially increase it to 40 mg and goal LDL would be <100 as she has no other traditional risk factors, I will obtain lipids along with LPA and lipoprotein B: A+ ratio. ? ?To see how she has progressed over time, I will repeat coronary calcium score.  This will further improve compliance with statins and also will reassure her clinical progression. ? ?Her blood pressure was markedly elevated today, review of her prior records from PCPs office reveal excellent control of blood pressure.  Hence I did not make any changes to her medications.  We will continue to monitor this. ? ? ? ?Yates DecampJay Emmie Frakes, MD, [facility] HospitalFACC ?12/06/2021, 10:15 AM ?Office: [phone] ?

## 2021-12-06 ENCOUNTER — Encounter: Payer: Self-pay | Admitting: Cardiology

## 2021-12-29 ENCOUNTER — Ambulatory Visit
Admission: RE | Admit: 2021-12-29 | Discharge: 2021-12-29 | Disposition: A | Discharge status: Home / Self Care | Payer: No Typology Code available for payment source | Source: Ambulatory Visit | Attending: Cardiology | Admitting: Cardiology

## 2021-12-29 DIAGNOSIS — I2510 Atherosclerotic heart disease of native coronary artery without angina pectoris: Secondary | ICD-10-CM

## 2021-12-31 NOTE — Progress Notes (Signed)
Coronary calcium score 12/30/2021: LAD 57.3 LCx 6.4 RCA 0. Total Agatston score 63.6, [place] database percentile 82.  Ascending aorta is normal in size.  No significant extracardiac abnormality.  Compared to 08/13/2019: Coronary calcium score of 32, 80th percentile in [employer].

## 2022-01-19 ENCOUNTER — Telehealth: Payer: Self-pay | Admitting: Cardiology

## 2022-01-19 NOTE — Telephone Encounter (Signed)
Can you set up OV

## 2022-01-19 NOTE — Telephone Encounter (Signed)
Patient reports stomach nausea taking simvastatin and would like to discuss alternatives. She said this was briefly discussed at her last appointment. Her next appointment is scheduled for 02/08/22 at 11:45 AM, there was no sooner appointment available.

## 2022-01-29 ENCOUNTER — Other Ambulatory Visit: Payer: Self-pay | Admitting: Cardiology

## 2022-01-29 DIAGNOSIS — E7800 Pure hypercholesterolemia, unspecified: Secondary | ICD-10-CM

## 2022-01-29 DIAGNOSIS — I2510 Atherosclerotic heart disease of native coronary artery without angina pectoris: Secondary | ICD-10-CM

## 2022-02-04 LAB — APO A1 + B + RATIO
Apolipo. B/A-1 Ratio: 0.4 ratio (ref 0.0–0.6)
Apolipoprotein A-1: 179 mg/dL (ref 116–209)
Apolipoprotein B: 66 mg/dL (ref <90)

## 2022-02-04 LAB — LIPOPROTEIN A (LPA): Lipoprotein (a): 31.2 nmol/L (ref <75.0)

## 2022-02-04 LAB — LIPID PANEL WITH LDL/HDL RATIO
Cholesterol, Total: 161 mg/dL (ref 100–199)
HDL: 71 mg/dL (ref >39)
LDL Chol Calc (NIH): 78 mg/dL (ref 0–99)
LDL/HDL Ratio: 1.1 ratio (ref 0.0–3.2)
Triglycerides: 63 mg/dL (ref 0–149)
VLDL Cholesterol Cal: 12 mg/dL (ref 5–40)

## 2022-02-04 LAB — LDL CHOLESTEROL, DIRECT: LDL Direct: 78 mg/dL (ref 0–99)

## 2022-02-08 ENCOUNTER — Encounter: Payer: Self-pay | Admitting: Cardiology

## 2022-02-08 ENCOUNTER — Ambulatory Visit: Payer: Managed Care, Other (non HMO) | Admitting: Cardiology

## 2022-02-08 VITALS — BP 130/82 | HR 79 | Temp 98.9°F | Resp 16 | Ht 66.0 in | Wt 187.0 lb

## 2022-02-08 DIAGNOSIS — R030 Elevated blood-pressure reading, without diagnosis of hypertension: Secondary | ICD-10-CM

## 2022-02-08 DIAGNOSIS — E7800 Pure hypercholesterolemia, unspecified: Secondary | ICD-10-CM

## 2022-02-08 DIAGNOSIS — I2510 Atherosclerotic heart disease of native coronary artery without angina pectoris: Secondary | ICD-10-CM

## 2022-02-08 DIAGNOSIS — R931 Abnormal findings on diagnostic imaging of heart and coronary circulation: Secondary | ICD-10-CM

## 2022-02-08 MED ORDER — SIMVASTATIN 20 MG PO TABS
ORAL_TABLET | ORAL | 3 refills | Status: DC
Start: 2022-02-08 — End: 2023-02-01

## 2022-03-09 ENCOUNTER — Other Ambulatory Visit (HOSPITAL_COMMUNITY): Payer: Self-pay

## 2022-03-09 MED ORDER — MOUNJARO 7.5 MG/0.5ML ~~LOC~~ SOAJ
7.5000 mg | SUBCUTANEOUS | 5 refills | Status: AC
  Filled 2022-03-09: qty 2, 28d supply, fill #0

## 2022-04-20 ENCOUNTER — Other Ambulatory Visit (HOSPITAL_COMMUNITY): Payer: Self-pay

## 2022-04-20 MED ORDER — MOUNJARO 7.5 MG/0.5ML ~~LOC~~ SOAJ
7.5000 mg | SUBCUTANEOUS | 5 refills | Status: AC
  Filled 2022-04-20 – 2022-04-21 (×2): qty 2, 28d supply, fill #0

## 2022-04-21 ENCOUNTER — Other Ambulatory Visit (HOSPITAL_COMMUNITY): Payer: Self-pay

## 2022-06-01 ENCOUNTER — Other Ambulatory Visit (HOSPITAL_COMMUNITY): Payer: Self-pay

## 2022-06-01 MED ORDER — MOUNJARO 10 MG/0.5ML ~~LOC~~ SOPN
10.0000 mg | PEN_INJECTOR | SUBCUTANEOUS | 5 refills | Status: AC
Start: 2022-06-01 — End: ?
  Filled 2022-06-01: qty 2, 28d supply, fill #0
  Filled 2022-06-30: qty 2, 28d supply, fill #1

## 2022-06-30 ENCOUNTER — Other Ambulatory Visit (HOSPITAL_COMMUNITY): Payer: Self-pay

## 2022-07-26 ENCOUNTER — Other Ambulatory Visit (HOSPITAL_COMMUNITY): Payer: Self-pay

## 2022-07-26 MED ORDER — MOUNJARO 10 MG/0.5ML ~~LOC~~ SOPN
10.0000 mg | PEN_INJECTOR | SUBCUTANEOUS | 5 refills | Status: AC
Start: 2022-07-26 — End: ?
  Filled 2022-07-26: qty 2, 28d supply, fill #0
  Filled 2022-08-25: qty 2, 28d supply, fill #1
  Filled 2022-09-08: qty 2, 28d supply, fill #2

## 2022-07-26 MED ORDER — MOUNJARO 10 MG/0.5ML ~~LOC~~ SOAJ
10.0000 mg | SUBCUTANEOUS | 5 refills | Status: DC
  Filled 2022-07-26: qty 2, 28d supply, fill #0

## 2022-09-03 ENCOUNTER — Other Ambulatory Visit (HOSPITAL_COMMUNITY): Payer: Self-pay

## 2022-09-03 MED ORDER — MOUNJARO 10 MG/0.5ML ~~LOC~~ SOAJ
10.0000 mg | SUBCUTANEOUS | 5 refills | Status: AC
  Filled 2022-09-03: qty 6, 84d supply, fill #0
  Filled 2022-09-08: qty 2, 28d supply, fill #0
  Filled 2022-09-09: qty 6, 84d supply, fill #0
  Filled 2022-09-09: qty 2, 28d supply, fill #0

## 2022-09-08 ENCOUNTER — Other Ambulatory Visit (HOSPITAL_COMMUNITY): Payer: Self-pay

## 2022-09-09 ENCOUNTER — Other Ambulatory Visit (HOSPITAL_BASED_OUTPATIENT_CLINIC_OR_DEPARTMENT_OTHER): Payer: Self-pay

## 2022-09-09 ENCOUNTER — Other Ambulatory Visit (HOSPITAL_COMMUNITY): Payer: Self-pay

## 2023-01-26 ENCOUNTER — Telehealth: Payer: Self-pay

## 2023-01-26 NOTE — Telephone Encounter (Signed)
Patient called and said that her hair has been falling out and wondered it the statin could be the cause?

## 2023-01-27 NOTE — Telephone Encounter (Signed)
Very unlikely, but anything is possible, She can stop for a month and see if it gets better and if no change, then it could be related to other drugs or stress or do not know. If no change in her hair fall stopping statin, then restart. Simva very unlikely to do this

## 2023-02-01 NOTE — Telephone Encounter (Signed)
Pt will stop for one month

## 2023-02-08 ENCOUNTER — Ambulatory Visit: Payer: Managed Care, Other (non HMO) | Admitting: Cardiology

## 2023-02-18 ENCOUNTER — Other Ambulatory Visit: Payer: Self-pay | Admitting: Cardiology

## 2023-02-18 DIAGNOSIS — I2510 Atherosclerotic heart disease of native coronary artery without angina pectoris: Secondary | ICD-10-CM

## 2023-02-18 DIAGNOSIS — E7800 Pure hypercholesterolemia, unspecified: Secondary | ICD-10-CM

## 2024-04-23 ENCOUNTER — Other Ambulatory Visit: Payer: Self-pay
